# Patient Record
Sex: Female | Born: 1950 | Race: White | Hispanic: No | Marital: Married | Smoking: Never smoker
Health system: Southern US, Community
[De-identification: ages and names within clinical notes are randomized; demographics above are authoritative.]

## PROBLEM LIST (undated history)

## (undated) DIAGNOSIS — K5792 Diverticulitis of intestine, part unspecified, without perforation or abscess without bleeding: Secondary | ICD-10-CM

## (undated) DIAGNOSIS — J45909 Unspecified asthma, uncomplicated: Secondary | ICD-10-CM

## (undated) DIAGNOSIS — K219 Gastro-esophageal reflux disease without esophagitis: Secondary | ICD-10-CM

## (undated) DIAGNOSIS — D649 Anemia, unspecified: Secondary | ICD-10-CM

## (undated) HISTORY — PX: TUBAL LIGATION: SHX77

## (undated) HISTORY — PX: FOOT SURGERY: SHX648

## (undated) HISTORY — PX: EYE SURGERY: SHX253

## (undated) HISTORY — PX: TONSILLECTOMY: SUR1361

---

## 2004-01-15 ENCOUNTER — Ambulatory Visit (HOSPITAL_COMMUNITY): Admission: RE | Admit: 2004-01-15 | Discharge: 2004-01-15 | Payer: Self-pay | Admitting: Dentistry

## 2004-09-13 ENCOUNTER — Emergency Department (HOSPITAL_COMMUNITY): Admission: EM | Admit: 2004-09-13 | Discharge: 2004-09-13 | Payer: Self-pay | Admitting: Emergency Medicine

## 2010-01-08 ENCOUNTER — Emergency Department (HOSPITAL_COMMUNITY): Admission: EM | Admit: 2010-01-08 | Discharge: 2010-01-09 | Payer: Self-pay | Admitting: Emergency Medicine

## 2010-07-01 ENCOUNTER — Ambulatory Visit (HOSPITAL_BASED_OUTPATIENT_CLINIC_OR_DEPARTMENT_OTHER): Admission: RE | Admit: 2010-07-01 | Discharge: 2010-07-01 | Payer: Self-pay | Admitting: Family Medicine

## 2010-07-01 ENCOUNTER — Ambulatory Visit: Payer: Self-pay | Admitting: Interventional Radiology

## 2010-12-06 LAB — DIFFERENTIAL
Basophils Absolute: 0.1 10*3/uL (ref 0.0–0.1)
Eosinophils Absolute: 0.3 10*3/uL (ref 0.0–0.7)
Eosinophils Relative: 2 % (ref 0–5)
Lymphocytes Relative: 22 % (ref 12–46)
Monocytes Absolute: 1 10*3/uL (ref 0.1–1.0)

## 2010-12-06 LAB — POCT I-STAT, CHEM 8
BUN: 4 mg/dL — ABNORMAL LOW (ref 6–23)
Chloride: 98 mEq/L (ref 96–112)
Creatinine, Ser: 0.7 mg/dL (ref 0.4–1.2)
Glucose, Bld: 104 mg/dL — ABNORMAL HIGH (ref 70–99)
HCT: 44 % (ref 36.0–46.0)
Potassium: 3.6 mEq/L (ref 3.5–5.1)

## 2010-12-06 LAB — URINALYSIS, ROUTINE W REFLEX MICROSCOPIC
Bilirubin Urine: NEGATIVE
Glucose, UA: NEGATIVE mg/dL
Ketones, ur: NEGATIVE mg/dL
Nitrite: NEGATIVE
Protein, ur: NEGATIVE mg/dL
pH: 6.5 (ref 5.0–8.0)

## 2010-12-06 LAB — CBC
HCT: 37.9 % (ref 36.0–46.0)
Hemoglobin: 12.5 g/dL (ref 12.0–15.0)
MCV: 84.2 fL (ref 78.0–100.0)
RDW: 13.2 % (ref 11.5–15.5)

## 2010-12-06 LAB — URINE MICROSCOPIC-ADD ON

## 2015-10-16 ENCOUNTER — Encounter (HOSPITAL_BASED_OUTPATIENT_CLINIC_OR_DEPARTMENT_OTHER): Payer: Self-pay | Admitting: *Deleted

## 2015-10-16 ENCOUNTER — Observation Stay (HOSPITAL_BASED_OUTPATIENT_CLINIC_OR_DEPARTMENT_OTHER)
Admission: EM | Admit: 2015-10-16 | Discharge: 2015-10-18 | Disposition: A | Discharge status: Home / Self Care | Payer: BLUE CROSS/BLUE SHIELD | Attending: General Surgery | Admitting: General Surgery

## 2015-10-16 ENCOUNTER — Emergency Department (HOSPITAL_BASED_OUTPATIENT_CLINIC_OR_DEPARTMENT_OTHER): Payer: BLUE CROSS/BLUE SHIELD

## 2015-10-16 DIAGNOSIS — K8000 Calculus of gallbladder with acute cholecystitis without obstruction: Secondary | ICD-10-CM | POA: Diagnosis present

## 2015-10-16 DIAGNOSIS — Z79899 Other long term (current) drug therapy: Secondary | ICD-10-CM | POA: Diagnosis not present

## 2015-10-16 DIAGNOSIS — K219 Gastro-esophageal reflux disease without esophagitis: Secondary | ICD-10-CM | POA: Diagnosis not present

## 2015-10-16 DIAGNOSIS — J45909 Unspecified asthma, uncomplicated: Secondary | ICD-10-CM | POA: Insufficient documentation

## 2015-10-16 DIAGNOSIS — R1013 Epigastric pain: Secondary | ICD-10-CM | POA: Diagnosis present

## 2015-10-16 DIAGNOSIS — Z7951 Long term (current) use of inhaled steroids: Secondary | ICD-10-CM | POA: Insufficient documentation

## 2015-10-16 DIAGNOSIS — K8010 Calculus of gallbladder with chronic cholecystitis without obstruction: Secondary | ICD-10-CM | POA: Diagnosis not present

## 2015-10-16 DIAGNOSIS — E669 Obesity, unspecified: Secondary | ICD-10-CM | POA: Diagnosis not present

## 2015-10-16 DIAGNOSIS — Z6833 Body mass index (BMI) 33.0-33.9, adult: Secondary | ICD-10-CM | POA: Diagnosis not present

## 2015-10-16 DIAGNOSIS — K819 Cholecystitis, unspecified: Secondary | ICD-10-CM

## 2015-10-16 HISTORY — DX: Unspecified asthma, uncomplicated: J45.909

## 2015-10-16 HISTORY — DX: Diverticulitis of intestine, part unspecified, without perforation or abscess without bleeding: K57.92

## 2015-10-16 HISTORY — DX: Anemia, unspecified: D64.9

## 2015-10-16 HISTORY — DX: Gastro-esophageal reflux disease without esophagitis: K21.9

## 2015-10-16 LAB — URINALYSIS, ROUTINE W REFLEX MICROSCOPIC
BILIRUBIN URINE: NEGATIVE
Glucose, UA: NEGATIVE mg/dL
Hgb urine dipstick: NEGATIVE
Ketones, ur: 15 mg/dL — AB
LEUKOCYTES UA: NEGATIVE
NITRITE: NEGATIVE
Protein, ur: NEGATIVE mg/dL
SPECIFIC GRAVITY, URINE: 1.018 (ref 1.005–1.030)
pH: 8 (ref 5.0–8.0)

## 2015-10-16 LAB — CBC WITH DIFFERENTIAL/PLATELET
BASOS ABS: 0 10*3/uL (ref 0.0–0.1)
BASOS PCT: 0 %
EOS PCT: 0 %
Eosinophils Absolute: 0 10*3/uL (ref 0.0–0.7)
HCT: 40.8 % (ref 36.0–46.0)
Hemoglobin: 13.3 g/dL (ref 12.0–15.0)
Lymphocytes Relative: 10 %
Lymphs Abs: 1.5 10*3/uL (ref 0.7–4.0)
MCH: 27.3 pg (ref 26.0–34.0)
MCHC: 32.6 g/dL (ref 30.0–36.0)
MCV: 83.6 fL (ref 78.0–100.0)
MONO ABS: 0.5 10*3/uL (ref 0.1–1.0)
MONOS PCT: 3 %
Neutro Abs: 12.6 10*3/uL — ABNORMAL HIGH (ref 1.7–7.7)
Neutrophils Relative %: 87 %
PLATELETS: 336 10*3/uL (ref 150–400)
RBC: 4.88 MIL/uL (ref 3.87–5.11)
RDW: 13.7 % (ref 11.5–15.5)
WBC: 14.7 10*3/uL — ABNORMAL HIGH (ref 4.0–10.5)

## 2015-10-16 LAB — COMPREHENSIVE METABOLIC PANEL
ALBUMIN: 4 g/dL (ref 3.5–5.0)
ALT: 39 U/L (ref 14–54)
ANION GAP: 8 (ref 5–15)
AST: 38 U/L (ref 15–41)
Alkaline Phosphatase: 63 U/L (ref 38–126)
BUN: 8 mg/dL (ref 6–20)
CHLORIDE: 101 mmol/L (ref 101–111)
CO2: 26 mmol/L (ref 22–32)
Calcium: 8.6 mg/dL — ABNORMAL LOW (ref 8.9–10.3)
Creatinine, Ser: 0.58 mg/dL (ref 0.44–1.00)
GFR calc Af Amer: >60 mL/min (ref >60)
Glucose, Bld: 198 mg/dL — ABNORMAL HIGH (ref 65–99)
POTASSIUM: 4.1 mmol/L (ref 3.5–5.1)
Sodium: 135 mmol/L (ref 135–145)
Total Bilirubin: 0.6 mg/dL (ref 0.3–1.2)
Total Protein: 7.7 g/dL (ref 6.5–8.1)

## 2015-10-16 LAB — LIPASE, BLOOD: Lipase: 21 U/L (ref 11–51)

## 2015-10-16 MED ORDER — GI COCKTAIL ~~LOC~~
30.0000 mL | Freq: Once | ORAL | Status: AC
  Administered 2015-10-16: 30 mL via ORAL
  Filled 2015-10-16: qty 30

## 2015-10-16 MED ORDER — FENTANYL CITRATE (PF) 100 MCG/2ML IJ SOLN
100.0000 ug | Freq: Once | INTRAMUSCULAR | Status: AC
  Administered 2015-10-16: 100 ug via INTRAVENOUS
  Filled 2015-10-16: qty 2

## 2015-10-16 MED ORDER — ONDANSETRON HCL 4 MG/2ML IJ SOLN
4.0000 mg | Freq: Once | INTRAMUSCULAR | Status: AC
  Administered 2015-10-16: 4 mg via INTRAVENOUS
  Filled 2015-10-16: qty 2

## 2015-10-16 NOTE — ED Provider Notes (Signed)
CSN: [id number]     Arrival date & time 10/16/15  2234 History  By signing my name below, I, Alicia Dawson, attest that this documentation has been prepared under the direction and in the presence of Myrtle Barnhard, MD.  Electronically Signed: Bethel Dawson, ED Scribe. 10/17/2015. 12:45 AM   Chief Complaint  Patient presents with  . Abdominal Pain    Patient is a 65 y.o. female presenting with abdominal pain. The history is provided by the patient. No language interpreter was used.  Abdominal Pain Pain location:  Generalized Pain quality: dull and sharp   Pain radiates to:  Does not radiate Pain severity:  Severe Onset quality:  Gradual Duration:  9 hours Timing:  Constant Progression:  Worsening Chronicity:  New Context: eating   Context: not sick contacts and not trauma   Relieved by:  Nothing Worsened by:  Nothing tried Ineffective treatments:  None tried Associated symptoms: flatus and nausea   Associated symptoms: no chest pain, no constipation, no diarrhea, no fever and no vomiting   Risk factors: no alcohol abuse    Alicia Dawson is a 65 y.o. female with history of diverticulitis who presents to the Emergency Department complaining of progressively worsening, 10/10 in severity, diffuse abdominal pain with gradual onset near 2:30 PM today. She states that the pain is sharp in the upper abdomen and dull in the lower abdomen. Prior to the onset of pain she had eaten Hibachi. Pt took a dose of hydrocodone, that she had left over from a foot surgery, but was unable to hold it down.  Associated symptoms include flatulence over the last several days, nausea, and dry heaving. She has had a cough over the last several days but denies diarrhea and constipation. No known sick contact. No new medication.   Past Medical History  Diagnosis Date  . Diverticulitis    Past Surgical History  Procedure Laterality Date  . Tonsillectomy    . Foot surgery    . Eye surgery    .  Tubal ligation     History reviewed. No pertinent family history. Social History  Substance Use Topics  . Smoking status: Never Smoker   . Smokeless tobacco: None  . Alcohol Use: Yes   OB History    No data available     Review of Systems  Constitutional: Negative for fever.  Cardiovascular: Negative for chest pain.  Gastrointestinal: Positive for nausea, abdominal pain and flatus. Negative for vomiting, diarrhea and constipation.  All other systems reviewed and are negative.  Allergies  Penicillins  Home Medications   Prior to Admission medications   Medication Sig Start Date End Date Taking? Authorizing Provider  albuterol (PROVENTIL HFA;VENTOLIN HFA) 108 (90 Base) MCG/ACT inhaler Inhale into the lungs every 6 (six) hours as needed for wheezing or shortness of breath.   Yes Historical Provider, MD  budesonide-formoterol (SYMBICORT) 160-4.5 MCG/ACT inhaler Inhale 2 puffs into the lungs 2 (two) times daily.   Yes Historical Provider, MD  esomeprazole (NEXIUM) 20 MG capsule Take 20 mg by mouth daily at 12 noon.   Yes Historical Provider, MD  montelukast (SINGULAIR) 10 MG tablet Take 10 mg by mouth at bedtime.   Yes Historical Provider, MD   BP 149/102 mmHg  Pulse 83  Temp(Src) 98.5 F (36.9 C) (Oral)  Resp 22  Ht 5\' 4"  (1.626 m)  Wt 203 lb 1.6 oz (92.126 kg)  BMI 34.85 kg/m2  SpO2 97% Physical Exam  Constitutional: She is oriented to  person, place, and time. She appears well-developed and well-nourished.  HENT:  Head: Normocephalic and atraumatic.  Mouth/Throat: Oropharynx is clear and moist.  Moist mucous membranes No exudate  Eyes: Pupils are equal, round, and reactive to light.  Neck: Normal range of motion. Neck supple.  Trachea midline No stridor No bruit  Cardiovascular: Normal rate and regular rhythm.   Pulmonary/Chest: Effort normal and breath sounds normal. No respiratory distress. She has no wheezes. She has no rales.  CTAB  Abdominal: Soft. She  exhibits no mass. Bowel sounds are increased. There is tenderness in the right upper quadrant and epigastric area. There is positive Murphy's sign. There is no rebound and no guarding.  Musculoskeletal: Normal range of motion.  Neurological: She is alert and oriented to person, place, and time. She has normal reflexes.  Skin: Skin is warm and dry.  Psychiatric: She has a normal mood and affect. Her behavior is normal.  Nursing note and vitals reviewed.   ED Course  Procedures (including critical care time) DIAGNOSTIC STUDIES: Oxygen Saturation is 97% on RA,  normal by my interpretation.    COORDINATION OF CARE: 11:16 PM Discussed treatment plan which includes lab work, abdominal US, Zofran, fentanyl, and GI cocktail with pt at bedside and pt agreed to plan.  12:39 AM-Consult complete with Dr. Donell Beers (Surgery). Patient case explained and discussed. Pt accepted to the ED at [facility]. She is to be started on Levaquin as an abx due to her penicillin allergy.   12:44 AM D/w Dr. Linwood Dibbles, ED Attending at [facility].   Labs Review Labs Reviewed  CBC WITH DIFFERENTIAL/PLATELET - Abnormal; Notable for the following:    WBC 14.7 (*)    Neutro Abs 12.6 (*)    All other components within normal limits  COMPREHENSIVE METABOLIC PANEL - Abnormal; Notable for the following:    Glucose, Bld 198 (*)    Calcium 8.6 (*)    All other components within normal limits  URINALYSIS, ROUTINE W REFLEX MICROSCOPIC (NOT AT [facility]) - Abnormal; Notable for the following:    Ketones, ur 15 (*)    All other components within normal limits  LIPASE, BLOOD    Imaging Review US Abdomen Complete  10/17/2015  CLINICAL DATA:  Right lower quadrant pain radiating to the abdomen since 2 p.m. after eating steak for launch. Nausea and vomiting. EXAM: ABDOMEN ULTRASOUND COMPLETE COMPARISON:  CT abdomen and pelvis 07/01/2010 FINDINGS: Gallbladder: Cholelithiasis with a in nonmobile stone demonstrated in the gallbladder  neck. Stone measures about 1.6 cm diameter. Gallbladder wall thickness is borderline. Murphy's sign is positive. Changes are consistent with acute cholecystitis in the appropriate clinical setting. Common bile duct: Diameter: 4.4 mm, normal Liver: Diffusely increased parenchymal echotexture consistent with fatty infiltration. No focal liver lesions identified. IVC: Not seen due to overlying bowel gas. Pancreas: Not seen due to overlying bowel gas. Spleen: Size and appearance within normal limits. Right Kidney: Length: 10.8 cm. Echogenicity within normal limits. No mass or hydronephrosis visualized. Left Kidney: Length: 11.3 cm. Echogenicity within normal limits. No mass or hydronephrosis visualized. Abdominal aorta: Mostly obscured by overlying bowel gas. Visualized portions are non aneurysmal. Other findings: Examination is technically limited due to bowel gas and patient's body habitus. IMPRESSION: Cholelithiasis with mild gallbladder wall thickening and positive Murphy's sign. Changes are consistent with acute cholecystitis in the appropriate clinical setting. Diffuse fatty infiltration of the liver. Electronically Signed   By: Burman Nieves M.D.   On: 10/17/2015 00:23  I have personally reviewed and evaluated these images and lab results as part of my medical decision-making.   EKG Interpretation None      MDM   Final diagnoses:  Cholecystitis    Results for orders placed or performed during the hospital encounter of 10/16/15  CBC with Differential/Platelet  Result Value Ref Range   WBC 14.7 (H) 4.0 - 10.5 K/uL   RBC 4.88 3.87 - 5.11 MIL/uL   Hemoglobin 13.3 12.0 - 15.0 g/dL   HCT 40.9 81.1 - 91.4 %   MCV 83.6 78.0 - 100.0 fL   MCH 27.3 26.0 - 34.0 pg   MCHC 32.6 30.0 - 36.0 g/dL   RDW 78.2 95.6 - 21.3 %   Platelets 336 150 - 400 K/uL   Neutrophils Relative % 87 %   Neutro Abs 12.6 (H) 1.7 - 7.7 K/uL   Lymphocytes Relative 10 %   Lymphs Abs 1.5 0.7 - 4.0 K/uL   Monocytes  Relative 3 %   Monocytes Absolute 0.5 0.1 - 1.0 K/uL   Eosinophils Relative 0 %   Eosinophils Absolute 0.0 0.0 - 0.7 K/uL   Basophils Relative 0 %   Basophils Absolute 0.0 0.0 - 0.1 K/uL  Comprehensive metabolic panel  Result Value Ref Range   Sodium 135 135 - 145 mmol/L   Potassium 4.1 3.5 - 5.1 mmol/L   Chloride 101 101 - 111 mmol/L   CO2 26 22 - 32 mmol/L   Glucose, Bld 198 (H) 65 - 99 mg/dL   BUN 8 6 - 20 mg/dL   Creatinine, Ser 0.86 0.44 - 1.00 mg/dL   Calcium 8.6 (L) 8.9 - 10.3 mg/dL   Total Protein 7.7 6.5 - 8.1 g/dL   Albumin 4.0 3.5 - 5.0 g/dL   AST 38 15 - 41 U/L   ALT 39 14 - 54 U/L   Alkaline Phosphatase 63 38 - 126 U/L   Total Bilirubin 0.6 0.3 - 1.2 mg/dL   GFR calc non Af Amer >60 >60 mL/min   GFR calc Af Amer >60 >60 mL/min   Anion gap 8 5 - 15  Lipase, blood  Result Value Ref Range   Lipase 21 11 - 51 U/L  Urinalysis, Routine w reflex microscopic (not at [facility])  Result Value Ref Range   Color, Urine YELLOW YELLOW   APPearance CLEAR CLEAR   Specific Gravity, Urine 1.018 1.005 - 1.030   pH 8.0 5.0 - 8.0   Glucose, UA NEGATIVE NEGATIVE mg/dL   Hgb urine dipstick NEGATIVE NEGATIVE   Bilirubin Urine NEGATIVE NEGATIVE   Ketones, ur 15 (A) NEGATIVE mg/dL   Protein, ur NEGATIVE NEGATIVE mg/dL   Nitrite NEGATIVE NEGATIVE   Leukocytes, UA NEGATIVE NEGATIVE   US Abdomen Complete  10/17/2015  CLINICAL DATA:  Right lower quadrant pain radiating to the abdomen since 2 p.m. after eating steak for launch. Nausea and vomiting. EXAM: ABDOMEN ULTRASOUND COMPLETE COMPARISON:  CT abdomen and pelvis 07/01/2010 FINDINGS: Gallbladder: Cholelithiasis with a in nonmobile stone demonstrated in the gallbladder neck. Stone measures about 1.6 cm diameter. Gallbladder wall thickness is borderline. Murphy's sign is positive. Changes are consistent with acute cholecystitis in the appropriate clinical setting. Common bile duct: Diameter: 4.4 mm, normal Liver: Diffusely increased parenchymal  echotexture consistent with fatty infiltration. No focal liver lesions identified. IVC: Not seen due to overlying bowel gas. Pancreas: Not seen due to overlying bowel gas. Spleen: Size and appearance within normal limits. Right Kidney: Length: 10.8 cm. Echogenicity within normal  limits. No mass or hydronephrosis visualized. Left Kidney: Length: 11.3 cm. Echogenicity within normal limits. No mass or hydronephrosis visualized. Abdominal aorta: Mostly obscured by overlying bowel gas. Visualized portions are non aneurysmal. Other findings: Examination is technically limited due to bowel gas and patient's body habitus. IMPRESSION: Cholelithiasis with mild gallbladder wall thickening and positive Murphy's sign. Changes are consistent with acute cholecystitis in the appropriate clinical setting. Diffuse fatty infiltration of the liver. Electronically Signed   By: Burman Nieves M.D.   On: 10/17/2015 00:23    Medications  levofloxacin (LEVAQUIN) IVPB 500 mg (500 mg Intravenous New Bag/Given 10/17/15 0054)  ondansetron (ZOFRAN) injection 4 mg (4 mg Intravenous Given 10/16/15 2330)  gi cocktail (Maalox,Lidocaine,Donnatal) (30 mLs Oral Given 10/16/15 2330)  fentaNYL (SUBLIMAZE) injection 100 mcg (100 mcg Intravenous Given 10/16/15 2330)  morphine 4 MG/ML injection 4 mg (4 mg Intravenous Given 10/17/15 0054)      I personally performed the services described in this documentation, which was scribed in my presence. The recorded information has been reviewed and is accurate.      Powell Halbert, MD 10/17/15 0100

## 2015-10-16 NOTE — ED Notes (Signed)
Patient is alert and oriented x4.  She is complaining of abdominal pain that started at 14:30.  She denies any history  Of this issue.  Patient has nausea with dry heaves.  Currently she rates her pain 10 of 10.

## 2015-10-17 ENCOUNTER — Observation Stay (HOSPITAL_COMMUNITY): Payer: BLUE CROSS/BLUE SHIELD

## 2015-10-17 ENCOUNTER — Encounter (HOSPITAL_COMMUNITY): Admission: EM | Disposition: A | Discharge status: Home / Self Care | Payer: Self-pay | Source: Home / Self Care

## 2015-10-17 ENCOUNTER — Observation Stay (HOSPITAL_COMMUNITY): Payer: BLUE CROSS/BLUE SHIELD | Admitting: Certified Registered Nurse Anesthetist

## 2015-10-17 ENCOUNTER — Encounter (HOSPITAL_BASED_OUTPATIENT_CLINIC_OR_DEPARTMENT_OTHER): Payer: Self-pay | Admitting: Emergency Medicine

## 2015-10-17 DIAGNOSIS — K819 Cholecystitis, unspecified: Secondary | ICD-10-CM | POA: Diagnosis present

## 2015-10-17 DIAGNOSIS — K8000 Calculus of gallbladder with acute cholecystitis without obstruction: Secondary | ICD-10-CM | POA: Diagnosis present

## 2015-10-17 HISTORY — PX: CHOLECYSTECTOMY: SHX55

## 2015-10-17 LAB — SURGICAL PCR SCREEN
MRSA, PCR: NEGATIVE
STAPHYLOCOCCUS AUREUS: NEGATIVE

## 2015-10-17 SURGERY — LAPAROSCOPIC CHOLECYSTECTOMY WITH INTRAOPERATIVE CHOLANGIOGRAM
Anesthesia: General | Site: Abdomen

## 2015-10-17 MED ORDER — LIDOCAINE HCL 1 % IJ SOLN
INTRAMUSCULAR | Status: DC | PRN
Start: 2015-10-17 — End: 2015-10-17
  Administered 2015-10-17: 5 mL

## 2015-10-17 MED ORDER — MIDAZOLAM HCL 2 MG/2ML IJ SOLN
INTRAMUSCULAR | Status: AC
  Filled 2015-10-17: qty 2

## 2015-10-17 MED ORDER — DEXAMETHASONE SODIUM PHOSPHATE 10 MG/ML IJ SOLN
INTRAMUSCULAR | Status: DC | PRN
  Administered 2015-10-17: 10 mg via INTRAVENOUS

## 2015-10-17 MED ORDER — CIPROFLOXACIN IN D5W 400 MG/200ML IV SOLN
INTRAVENOUS | Status: AC
  Filled 2015-10-17: qty 200

## 2015-10-17 MED ORDER — 0.9 % SODIUM CHLORIDE (POUR BTL) OPTIME
TOPICAL | Status: DC | PRN
  Administered 2015-10-17: 1000 mL

## 2015-10-17 MED ORDER — LACTATED RINGERS IR SOLN
Status: DC | PRN
  Administered 2015-10-17: 1

## 2015-10-17 MED ORDER — ALBUTEROL SULFATE HFA 108 (90 BASE) MCG/ACT IN AERS
INHALATION_SPRAY | RESPIRATORY_TRACT | Status: DC | PRN
  Administered 2015-10-17: 2 via RESPIRATORY_TRACT
  Administered 2015-10-17: 5 via RESPIRATORY_TRACT

## 2015-10-17 MED ORDER — PROMETHAZINE HCL 25 MG/ML IJ SOLN: 6.2500 mg | INTRAMUSCULAR | Status: DC | PRN

## 2015-10-17 MED ORDER — DEXAMETHASONE SODIUM PHOSPHATE 10 MG/ML IJ SOLN
INTRAMUSCULAR | Status: AC
  Filled 2015-10-17: qty 1

## 2015-10-17 MED ORDER — LEVOFLOXACIN IN D5W 500 MG/100ML IV SOLN: 500.0000 mg | INTRAVENOUS | Status: DC

## 2015-10-17 MED ORDER — CIPROFLOXACIN IN D5W 400 MG/200ML IV SOLN
400.0000 mg | Freq: Two times a day (BID) | INTRAVENOUS | Status: DC
  Administered 2015-10-17 (×2): 400 mg via INTRAVENOUS
  Filled 2015-10-17 (×2): qty 200

## 2015-10-17 MED ORDER — KCL IN DEXTROSE-NACL 20-5-0.45 MEQ/L-%-% IV SOLN
INTRAVENOUS | Status: DC
  Administered 2015-10-17: 16:00:00 via INTRAVENOUS
  Administered 2015-10-17: 100 mL/h via INTRAVENOUS
  Administered 2015-10-17 (×2): via INTRAVENOUS
  Filled 2015-10-17 (×4): qty 1000

## 2015-10-17 MED ORDER — OXYCODONE HCL 5 MG PO TABS
5.0000 mg | ORAL_TABLET | ORAL | Status: DC | PRN
  Administered 2015-10-17 (×2): 5 mg via ORAL
  Administered 2015-10-18: 10 mg via ORAL
  Administered 2015-10-18: 5 mg via ORAL
  Filled 2015-10-17: qty 1
  Filled 2015-10-17: qty 2
  Filled 2015-10-17 (×2): qty 1

## 2015-10-17 MED ORDER — MONTELUKAST SODIUM 10 MG PO TABS
10.0000 mg | ORAL_TABLET | Freq: Every day | ORAL | Status: DC
  Administered 2015-10-17: 10 mg via ORAL
  Filled 2015-10-17 (×2): qty 1

## 2015-10-17 MED ORDER — BUPIVACAINE-EPINEPHRINE (PF) 0.25% -1:200000 IJ SOLN
INTRAMUSCULAR | Status: AC
  Filled 2015-10-17: qty 30

## 2015-10-17 MED ORDER — DEXTROSE-NACL 5-0.45 % IV SOLN
INTRAVENOUS | Status: AC
  Administered 2015-10-17: 05:00:00 via INTRAVENOUS

## 2015-10-17 MED ORDER — LEVOFLOXACIN IN D5W 500 MG/100ML IV SOLN
500.0000 mg | Freq: Once | INTRAVENOUS | Status: AC
  Administered 2015-10-17: 500 mg via INTRAVENOUS
  Filled 2015-10-17: qty 100

## 2015-10-17 MED ORDER — PROPOFOL 10 MG/ML IV BOLUS
INTRAVENOUS | Status: AC
  Filled 2015-10-17: qty 20

## 2015-10-17 MED ORDER — ROCURONIUM BROMIDE 100 MG/10ML IV SOLN
INTRAVENOUS | Status: AC
  Filled 2015-10-17: qty 1

## 2015-10-17 MED ORDER — SUCCINYLCHOLINE CHLORIDE 20 MG/ML IJ SOLN
INTRAMUSCULAR | Status: DC | PRN
  Administered 2015-10-17: 100 mg via INTRAVENOUS

## 2015-10-17 MED ORDER — LACTATED RINGERS IV SOLN
INTRAVENOUS | Status: DC | PRN
  Administered 2015-10-17: 09:00:00 via INTRAVENOUS

## 2015-10-17 MED ORDER — DIPHENHYDRAMINE HCL 12.5 MG/5ML PO ELIX: 12.5000 mg | ORAL_SOLUTION | Freq: Four times a day (QID) | ORAL | Status: DC | PRN

## 2015-10-17 MED ORDER — PROPOFOL 10 MG/ML IV BOLUS
INTRAVENOUS | Status: DC | PRN
  Administered 2015-10-17: 150 mg via INTRAVENOUS

## 2015-10-17 MED ORDER — FENTANYL CITRATE (PF) 100 MCG/2ML IJ SOLN
INTRAMUSCULAR | Status: DC | PRN
  Administered 2015-10-17 (×5): 50 ug via INTRAVENOUS

## 2015-10-17 MED ORDER — ONDANSETRON HCL 4 MG/2ML IJ SOLN
4.0000 mg | Freq: Three times a day (TID) | INTRAMUSCULAR | Status: AC | PRN
  Administered 2015-10-17: 4 mg via INTRAVENOUS
  Filled 2015-10-17: qty 2

## 2015-10-17 MED ORDER — HYDROMORPHONE HCL 1 MG/ML IJ SOLN
1.0000 mg | INTRAMUSCULAR | Status: DC | PRN
Start: 2015-10-17 — End: 2015-10-17

## 2015-10-17 MED ORDER — DIPHENHYDRAMINE HCL 50 MG/ML IJ SOLN: 12.5000 mg | Freq: Four times a day (QID) | INTRAMUSCULAR | Status: DC | PRN

## 2015-10-17 MED ORDER — MORPHINE SULFATE (PF) 4 MG/ML IV SOLN
4.0000 mg | Freq: Once | INTRAVENOUS | Status: AC
  Administered 2015-10-17: 4 mg via INTRAVENOUS
  Filled 2015-10-17: qty 1

## 2015-10-17 MED ORDER — HYDROMORPHONE HCL 1 MG/ML IJ SOLN
0.5000 mg | INTRAMUSCULAR | Status: DC | PRN
  Administered 2015-10-17 (×2): 0.5 mg via INTRAVENOUS
  Filled 2015-10-17 (×2): qty 1

## 2015-10-17 MED ORDER — HYDROMORPHONE HCL 1 MG/ML IJ SOLN
INTRAMUSCULAR | Status: AC
  Filled 2015-10-17: qty 1

## 2015-10-17 MED ORDER — DOCUSATE SODIUM 100 MG PO CAPS
100.0000 mg | ORAL_CAPSULE | Freq: Two times a day (BID) | ORAL | Status: DC
  Administered 2015-10-17 – 2015-10-18 (×2): 100 mg via ORAL
  Filled 2015-10-17 (×3): qty 1

## 2015-10-17 MED ORDER — KETOROLAC TROMETHAMINE 30 MG/ML IJ SOLN
30.0000 mg | Freq: Once | INTRAMUSCULAR | Status: DC
  Administered 2015-10-17: 30 mg via INTRAVENOUS

## 2015-10-17 MED ORDER — LACTATED RINGERS IV SOLN: INTRAVENOUS | Status: DC

## 2015-10-17 MED ORDER — ROCURONIUM BROMIDE 100 MG/10ML IV SOLN
INTRAVENOUS | Status: DC | PRN
  Administered 2015-10-17: 35 mg via INTRAVENOUS

## 2015-10-17 MED ORDER — ONDANSETRON HCL 4 MG/2ML IJ SOLN
INTRAMUSCULAR | Status: AC
  Filled 2015-10-17: qty 2

## 2015-10-17 MED ORDER — LIDOCAINE HCL (CARDIAC) 20 MG/ML IV SOLN
INTRAVENOUS | Status: DC | PRN
  Administered 2015-10-17: 100 mg via INTRAVENOUS

## 2015-10-17 MED ORDER — LIDOCAINE HCL 1 % IJ SOLN
INTRAMUSCULAR | Status: AC
  Filled 2015-10-17: qty 20

## 2015-10-17 MED ORDER — SIMETHICONE 80 MG PO CHEW
40.0000 mg | CHEWABLE_TABLET | Freq: Four times a day (QID) | ORAL | Status: DC | PRN
  Filled 2015-10-17: qty 1

## 2015-10-17 MED ORDER — FENTANYL CITRATE (PF) 250 MCG/5ML IJ SOLN
INTRAMUSCULAR | Status: AC
  Filled 2015-10-17: qty 5

## 2015-10-17 MED ORDER — MEPERIDINE HCL 50 MG/ML IJ SOLN: 6.2500 mg | INTRAMUSCULAR | Status: DC | PRN

## 2015-10-17 MED ORDER — KETOROLAC TROMETHAMINE 30 MG/ML IJ SOLN
INTRAMUSCULAR | Status: AC
  Filled 2015-10-17: qty 1

## 2015-10-17 MED ORDER — ACETAMINOPHEN 650 MG RE SUPP: 650.0000 mg | Freq: Four times a day (QID) | RECTAL | Status: DC | PRN

## 2015-10-17 MED ORDER — ACETAMINOPHEN 325 MG PO TABS: 650.0000 mg | ORAL_TABLET | Freq: Four times a day (QID) | ORAL | Status: DC | PRN

## 2015-10-17 MED ORDER — BUDESONIDE-FORMOTEROL FUMARATE 160-4.5 MCG/ACT IN AERO
2.0000 | INHALATION_SPRAY | Freq: Two times a day (BID) | RESPIRATORY_TRACT | Status: DC
  Administered 2015-10-17 – 2015-10-18 (×3): 2 via RESPIRATORY_TRACT
  Filled 2015-10-17: qty 6

## 2015-10-17 MED ORDER — ALBUTEROL SULFATE HFA 108 (90 BASE) MCG/ACT IN AERS
INHALATION_SPRAY | RESPIRATORY_TRACT | Status: AC
  Filled 2015-10-17: qty 6.7

## 2015-10-17 MED ORDER — LORATADINE 10 MG PO TABS
10.0000 mg | ORAL_TABLET | Freq: Every day | ORAL | Status: DC
  Administered 2015-10-18: 10 mg via ORAL
  Filled 2015-10-17 (×2): qty 1

## 2015-10-17 MED ORDER — METHOCARBAMOL 500 MG PO TABS: 500.0000 mg | ORAL_TABLET | Freq: Four times a day (QID) | ORAL | Status: DC | PRN

## 2015-10-17 MED ORDER — LIDOCAINE HCL (CARDIAC) 20 MG/ML IV SOLN
INTRAVENOUS | Status: AC
  Filled 2015-10-17: qty 5

## 2015-10-17 MED ORDER — PANTOPRAZOLE SODIUM 40 MG PO TBEC
40.0000 mg | DELAYED_RELEASE_TABLET | Freq: Every day | ORAL | Status: DC
  Administered 2015-10-18: 40 mg via ORAL
  Filled 2015-10-17: qty 1

## 2015-10-17 MED ORDER — IOHEXOL 300 MG/ML  SOLN
INTRAMUSCULAR | Status: DC | PRN
  Administered 2015-10-17: 5 mL

## 2015-10-17 MED ORDER — HYDRALAZINE HCL 20 MG/ML IJ SOLN
10.0000 mg | INTRAMUSCULAR | Status: DC | PRN
Start: 2015-10-17 — End: 2015-10-18

## 2015-10-17 MED ORDER — SUGAMMADEX SODIUM 200 MG/2ML IV SOLN
INTRAVENOUS | Status: DC | PRN
  Administered 2015-10-17: 200 mg via INTRAVENOUS

## 2015-10-17 MED ORDER — METRONIDAZOLE IN NACL 5-0.79 MG/ML-% IV SOLN
500.0000 mg | Freq: Three times a day (TID) | INTRAVENOUS | Status: DC
  Administered 2015-10-17 – 2015-10-18 (×4): 500 mg via INTRAVENOUS
  Filled 2015-10-17 (×6): qty 100

## 2015-10-17 MED ORDER — MIDAZOLAM HCL 5 MG/5ML IJ SOLN
INTRAMUSCULAR | Status: DC | PRN
  Administered 2015-10-17: 2 mg via INTRAVENOUS

## 2015-10-17 MED ORDER — ALBUTEROL SULFATE (2.5 MG/3ML) 0.083% IN NEBU: 2.5000 mg | INHALATION_SOLUTION | Freq: Four times a day (QID) | RESPIRATORY_TRACT | Status: DC | PRN

## 2015-10-17 MED ORDER — HYDROMORPHONE HCL 1 MG/ML IJ SOLN
0.2500 mg | INTRAMUSCULAR | Status: DC | PRN
  Administered 2015-10-17 (×2): 0.5 mg via INTRAVENOUS

## 2015-10-17 MED ORDER — BUPIVACAINE-EPINEPHRINE 0.25% -1:200000 IJ SOLN
INTRAMUSCULAR | Status: DC | PRN
  Administered 2015-10-17: 5 mL

## 2015-10-17 MED ORDER — ONDANSETRON HCL 4 MG/2ML IJ SOLN
INTRAMUSCULAR | Status: DC | PRN
  Administered 2015-10-17: 4 mg via INTRAVENOUS

## 2015-10-17 MED ORDER — KCL IN DEXTROSE-NACL 20-5-0.45 MEQ/L-%-% IV SOLN
INTRAVENOUS | Status: AC
  Filled 2015-10-17: qty 1000

## 2015-10-17 SURGICAL SUPPLY — 31 items
APPLIER CLIP ROT 10 11.4 M/L (STAPLE) ×3
APR CLP MED LRG 11.4X10 (STAPLE) ×1
BAG SPEC RTRVL LRG 6X4 10 (ENDOMECHANICALS)
CATH REDDICK CHOLANGI 4FR 50CM (CATHETERS) ×2 IMPLANT
CHLORAPREP W/TINT 26ML (MISCELLANEOUS) ×3 IMPLANT
CLIP APPLIE ROT 10 11.4 M/L (STAPLE) ×1 IMPLANT
COVER MAYO STAND STRL (DRAPES) ×3 IMPLANT
COVER SURGICAL LIGHT HANDLE (MISCELLANEOUS) IMPLANT
DECANTER SPIKE VIAL GLASS SM (MISCELLANEOUS) ×3 IMPLANT
DRAPE C-ARM 42X120 X-RAY (DRAPES) ×3 IMPLANT
DRAPE LAPAROSCOPIC ABDOMINAL (DRAPES) ×3 IMPLANT
ELECT REM PT RETURN 9FT ADLT (ELECTROSURGICAL) ×3
ELECTRODE REM PT RTRN 9FT ADLT (ELECTROSURGICAL) ×1 IMPLANT
GLOVE BIOGEL PI IND STRL 7.5 (GLOVE) ×1 IMPLANT
GLOVE BIOGEL PI INDICATOR 7.5 (GLOVE) ×2
GLOVE ECLIPSE 7.5 STRL STRAW (GLOVE) ×3 IMPLANT
GOWN STRL REUS W/TWL XL LVL3 (GOWN DISPOSABLE) ×12 IMPLANT
HEMOSTAT SNOW SURGICEL 2X4 (HEMOSTASIS) IMPLANT
HEMOSTAT SURGICEL 4X8 (HEMOSTASIS) IMPLANT
KIT BASIN OR (CUSTOM PROCEDURE TRAY) ×3 IMPLANT
LIQUID BAND (GAUZE/BANDAGES/DRESSINGS) ×3 IMPLANT
POUCH SPECIMEN RETRIEVAL 10MM (ENDOMECHANICALS) IMPLANT
SCISSORS LAP 5X35 DISP (ENDOMECHANICALS) ×3 IMPLANT
SET CHOLANGIOGRAPH MIX (MISCELLANEOUS) ×3 IMPLANT
SET IRRIG TUBING LAPAROSCOPIC (IRRIGATION / IRRIGATOR) ×3 IMPLANT
SLEEVE XCEL OPT CAN 5 100 (ENDOMECHANICALS) ×3 IMPLANT
SUT MNCRL AB 4-0 PS2 18 (SUTURE) ×3 IMPLANT
TRAY LAPAROSCOPIC (CUSTOM PROCEDURE TRAY) ×3 IMPLANT
TROCAR BLADELESS OPT 5 100 (ENDOMECHANICALS) ×3 IMPLANT
TROCAR XCEL BLUNT TIP 100MML (ENDOMECHANICALS) ×3 IMPLANT
TROCAR XCEL NON-BLD 11X100MML (ENDOMECHANICALS) ×3 IMPLANT

## 2015-10-17 NOTE — Transfer of Care (Signed)
Immediate Anesthesia Transfer of Care Note  Patient: Alicia Dawson  Procedure(s) Performed: Procedure(s): LAPAROSCOPIC CHOLECYSTECTOMY WITH INTRAOPERATIVE CHOLANGIOGRAM (N/A)  Patient Location: PACU  Anesthesia Type:General  Level of Consciousness:  sedated, patient cooperative and responds to stimulation  Airway & Oxygen Therapy:Patient Spontanous Breathing and Patient connected to face mask oxgen  Post-op Assessment:  Report given to PACU RN and Post -op Vital signs reviewed and stable  Post vital signs:  Reviewed and stable  Last Vitals:  Filed Vitals:   10/17/15 0827 10/17/15 1045  BP: 142/60   Pulse: 74   Temp: 36.7 C 36.7 C  Resp: 16     Complications: No apparent anesthesia complications

## 2015-10-17 NOTE — ED Provider Notes (Signed)
Dr Donell Beers requested I write holding orders to get the patient admitted upstairs.   Plan on OR later today.  Linwood Dibbles, MD 10/17/15 [phone]

## 2015-10-17 NOTE — Anesthesia Postprocedure Evaluation (Signed)
Anesthesia Post Note  Patient: Alicia Dawson  Procedure(s) Performed: Procedure(s) (LRB): LAPAROSCOPIC CHOLECYSTECTOMY WITH INTRAOPERATIVE CHOLANGIOGRAM (N/A)  Patient location during evaluation: PACU Anesthesia Type: General Level of consciousness: sedated, patient cooperative and oriented Pain management: pain level controlled Vital Signs Assessment: post-procedure vital signs reviewed and stable Respiratory status: spontaneous breathing Cardiovascular status: stable Postop Assessment: no signs of nausea or vomiting Anesthetic complications: no    Last Vitals:  Filed Vitals:   10/17/15 0827 10/17/15 1045  BP: 142/60 159/68  Pulse: 74 89  Temp: 36.7 C 36.7 C  Resp: 16 13    Last Pain:  Filed Vitals:   10/17/15 1057  PainSc: 0-No pain                 Jayin Derousse JR,JOHN Jamaiyah Pyle

## 2015-10-17 NOTE — Anesthesia Procedure Notes (Signed)
Procedure Name: Intubation Date/Time: 10/17/2015 9:42 AM Performed by: Orest Dikes Pre-anesthesia Checklist: Patient identified, Emergency Drugs available, Suction available and Patient being monitored Patient Re-evaluated:Patient Re-evaluated prior to inductionOxygen Delivery Method: Circle System Utilized Preoxygenation: Pre-oxygenation with 100% oxygen Intubation Type: IV induction Ventilation: Mask ventilation without difficulty Laryngoscope Size: Mac and 4 Grade View: Grade I Tube type: Oral Tube size: 7.5 mm Number of attempts: 1 Airway Equipment and Method: Stylet Placement Confirmation: ETT inserted through vocal cords under direct vision,  positive ETCO2 and breath sounds checked- equal and bilateral Secured at: 21 cm Tube secured with: Tape Dental Injury: Teeth and Oropharynx as per pre-operative assessment

## 2015-10-17 NOTE — ED Notes (Signed)
Bed: OZ30 Expected date:  Expected time:  Means of arrival:  Comments: [facility]

## 2015-10-17 NOTE — Op Note (Signed)
Laparoscopic Cholecystectomy with IOC Procedure Note  Indications: This patient presents with acute calculous cholecystitis and will undergo laparoscopic cholecystectomy.  Pre-operative Diagnosis: see above  Post-operative Diagnosis: Same  Surgeon: Almond Lint   Assistants: Jaclynn Guarneri, MD  Anesthesia: General endotracheal anesthesia and local  ASA Class: 2  Procedure Details  The patient was seen again in the Holding Room. The risks, benefits, complications, treatment options, and expected outcomes were discussed with the patient. The possibilities of  bleeding, recurrent infection, damage to nearby structures, the need for additional procedures, failure to diagnose a condition, the possible need to convert to an open procedure, and creating a complication requiring transfusion or operation were discussed with the patient. The likelihood of improving the patient's symptoms with return to their baseline status is good.    The patient and/or family concurred with the proposed plan, giving informed consent. The site of surgery properly noted. The patient was taken to Operating Room, and the procedure verified as Laparoscopic Cholecystectomy with Intraoperative Cholangiogram. A Time Out was held and the above information confirmed.  Prior to the induction of general anesthesia, antibiotic prophylaxis was administered. General endotracheal anesthesia was then administered and tolerated well. After the induction, the abdomen was prepped with Chloraprep and draped in the sterile fashion. The patient was positioned in the supine position.  Local anesthetic agent was injected into the skin near the umbilicus and an incision made. We dissected down to the abdominal fascia with blunt dissection.  The fascia was incised vertically and we entered the peritoneal cavity bluntly.  A pursestring suture of 0-Vicryl was placed around the fascial opening.  The Hasson cannula was inserted and secured with the  stay suture.  Pneumoperitoneum was then created with CO2 and tolerated well without any adverse changes in the patient's vital signs. An 11-mm port was placed in the subxiphoid position.  Two 5-mm ports were placed in the right upper quadrant. All skin incisions were infiltrated with a local anesthetic agent before making the incision and placing the trocars.   We positioned the patient in reverse Trendelenburg, tilted slightly to the patient's left.  The gallbladder was identified, the fundus grasped and retracted cephalad. Adhesions were lysed bluntly and with the electrocautery where indicated, taking care not to injure any adjacent organs or viscus. The infundibulum was grasped and retracted laterally, exposing the peritoneum overlying the triangle of Calot. This was then divided and exposed in a blunt fashion. A critical view of the cystic duct and cystic artery was obtained.  The cystic duct was clearly identified and bluntly dissected circumferentially. The cystic duct was ligated with a clip distally.   An incision was made in the cystic duct and the [facility] cholangiogram catheter introduced. The catheter was secured using a clip. A cholangiogram was then performed, demonstrating good filling of common bile duct, common hepatic duct, left and right hepatic ducts, and good filling into the duodenum without filling defects..  The cystic duct was then ligated with clips and divided. The cystic artery was identified, dissected free, ligated with clips and divided as well.   The gallbladder was dissected from the liver bed in retrograde fashion with the electrocautery. The gallbladder was removed and placed in an Endocatch bag.  The gallbladder and Endocatch bag were then removed through the umbilical port site.  The liver bed was irrigated and inspected. Hemostasis was achieved with the electrocautery. Copious irrigation was utilized and was repeatedly aspirated until clear.    We again inspected the right  upper quadrant for hemostasis.  Pneumoperitoneum was released as we removed the trocars.   The pursestring suture was used to close the umbilical fascia.  4-0 Monocryl was used to close the skin.   The skin was cleaned and dry, and Dermabond was applied. The patient was then extubated and brought to the recovery room in stable condition. Instrument, sponge, and needle counts were correct at closure and at the conclusion of the case.   Findings: Early acute cholecystitis.    Estimated Blood Loss: min         Drains: none          Specimens: Gallbladder to pathology       Complications: None; patient tolerated the procedure well.         Disposition: PACU - hemodynamically stable.         Condition: stable

## 2015-10-17 NOTE — H&P (Signed)
Alicia Dawson is an 65 y.o. female.   Chief Complaint: abdominal pain HPI:  Pt is a 65 yo F who presents with severe epigastric abdominal pain after eating hibachi.  She also had associated nausea/vomiting.  She denies diarrhea/constipation.  She has not had any fever/ chills.  The pain is centered in the upper abdomen and is very sharp.  She has a history of diverticulitis and states this is nothing like that.  She is still a little sore, but nothing like the pain from yesterday.    Past Medical History  Diagnosis Date  . Diverticulitis   . Asthma   . GERD (gastroesophageal reflux disease)   . Anemia     Past Surgical History  Procedure Laterality Date  . Tonsillectomy    . Foot surgery    . Eye surgery    . Tubal ligation      History reviewed. No pertinent family history. Social History:  reports that she has never smoked. She does not have any smokeless tobacco history on file. She reports that she drinks alcohol. Her drug history is not on file.  Allergies:  Allergies  Allergen Reactions  . Penicillins Anaphylaxis    Has patient had a PCN reaction causing immediate rash, facial/tongue/throat swelling, SOB or lightheadedness with hypotension: No Has patient had a PCN reaction causing severe rash involving mucus membranes or skin necrosis: Yes Has patient had a PCN reaction that required hospitalization Yes Has patient had a PCN reaction occurring within the last 10 years: no If all of the above answers are "NO", then may proceed with Cephalosporin use.    Medications Prior to Admission  Medication Sig Dispense Refill  . albuterol (PROVENTIL HFA;VENTOLIN HFA) 108 (90 Base) MCG/ACT inhaler Inhale into the lungs every 6 (six) hours as needed for wheezing or shortness of breath.    . budesonide-formoterol (SYMBICORT) 160-4.5 MCG/ACT inhaler Inhale 2 puffs into the lungs 2 (two) times daily.    . cetirizine (ZYRTEC) 10 MG tablet Take 10 mg by mouth daily.    Marland Kitchen  esomeprazole (NEXIUM) 20 MG capsule Take 20 mg by mouth daily at 12 noon.    . montelukast (SINGULAIR) 10 MG tablet Take 10 mg by mouth at bedtime.    . Multiple Vitamin (MULTIVITAMIN WITH MINERALS) TABS tablet Take 1 tablet by mouth daily.      Results for orders placed or performed during the hospital encounter of 10/16/15 (from the past 48 hour(s))  Urinalysis, Routine w reflex microscopic (not at [facility])     Status: Abnormal   Collection Time: 10/16/15 11:15 PM  Result Value Ref Range   Color, Urine YELLOW YELLOW   APPearance CLEAR CLEAR   Specific Gravity, Urine 1.018 1.005 - 1.030   pH 8.0 5.0 - 8.0   Glucose, UA NEGATIVE NEGATIVE mg/dL   Hgb urine dipstick NEGATIVE NEGATIVE   Bilirubin Urine NEGATIVE NEGATIVE   Ketones, ur 15 (A) NEGATIVE mg/dL   Protein, ur NEGATIVE NEGATIVE mg/dL   Nitrite NEGATIVE NEGATIVE   Leukocytes, UA NEGATIVE NEGATIVE    Comment: MICROSCOPIC NOT DONE ON URINES WITH NEGATIVE PROTEIN, BLOOD, LEUKOCYTES, NITRITE, OR GLUCOSE <1000 mg/dL.  CBC with Differential/Platelet     Status: Abnormal   Collection Time: 10/16/15 11:22 PM  Result Value Ref Range   WBC 14.7 (H) 4.0 - 10.5 K/uL   RBC 4.88 3.87 - 5.11 MIL/uL   Hemoglobin 13.3 12.0 - 15.0 g/dL   HCT 40.8 36.0 - 46.0 %  MCV 83.6 78.0 - 100.0 fL   MCH 27.3 26.0 - 34.0 pg   MCHC 32.6 30.0 - 36.0 g/dL   RDW 13.7 11.5 - 15.5 %   Platelets 336 150 - 400 K/uL   Neutrophils Relative % 87 %   Neutro Abs 12.6 (H) 1.7 - 7.7 K/uL   Lymphocytes Relative 10 %   Lymphs Abs 1.5 0.7 - 4.0 K/uL   Monocytes Relative 3 %   Monocytes Absolute 0.5 0.1 - 1.0 K/uL   Eosinophils Relative 0 %   Eosinophils Absolute 0.0 0.0 - 0.7 K/uL   Basophils Relative 0 %   Basophils Absolute 0.0 0.0 - 0.1 K/uL  Comprehensive metabolic panel     Status: Abnormal   Collection Time: 10/16/15 11:22 PM  Result Value Ref Range   Sodium 135 135 - 145 mmol/L   Potassium 4.1 3.5 - 5.1 mmol/L   Chloride 101 101 - 111 mmol/L   CO2 26 22 -  32 mmol/L   Glucose, Bld 198 (H) 65 - 99 mg/dL   BUN 8 6 - 20 mg/dL   Creatinine, Ser 0.58 0.44 - 1.00 mg/dL   Calcium 8.6 (L) 8.9 - 10.3 mg/dL   Total Protein 7.7 6.5 - 8.1 g/dL   Albumin 4.0 3.5 - 5.0 g/dL   AST 38 15 - 41 U/L   ALT 39 14 - 54 U/L   Alkaline Phosphatase 63 38 - 126 U/L   Total Bilirubin 0.6 0.3 - 1.2 mg/dL   GFR calc non Af Amer >60 >60 mL/min   GFR calc Af Amer >60 >60 mL/min    Comment: (NOTE) The eGFR has been calculated using the CKD EPI equation. This calculation has not been validated in all clinical situations. eGFR's persistently <60 mL/min signify possible Chronic Kidney Disease.    Anion gap 8 5 - 15  Lipase, blood     Status: None   Collection Time: 10/16/15 11:22 PM  Result Value Ref Range   Lipase 21 11 - 51 U/L   US Abdomen Complete  10/17/2015  CLINICAL DATA:  Right lower quadrant pain radiating to the abdomen since 2 p.m. after eating steak for launch. Nausea and vomiting. EXAM: ABDOMEN ULTRASOUND COMPLETE COMPARISON:  CT abdomen and pelvis 07/01/2010 FINDINGS: Gallbladder: Cholelithiasis with a in nonmobile stone demonstrated in the gallbladder neck. Stone measures about 1.6 cm diameter. Gallbladder wall thickness is borderline. Murphy's sign is positive. Changes are consistent with acute cholecystitis in the appropriate clinical setting. Common bile duct: Diameter: 4.4 mm, normal Liver: Diffusely increased parenchymal echotexture consistent with fatty infiltration. No focal liver lesions identified. IVC: Not seen due to overlying bowel gas. Pancreas: Not seen due to overlying bowel gas. Spleen: Size and appearance within normal limits. Right Kidney: Length: 10.8 cm. Echogenicity within normal limits. No mass or hydronephrosis visualized. Left Kidney: Length: 11.3 cm. Echogenicity within normal limits. No mass or hydronephrosis visualized. Abdominal aorta: Mostly obscured by overlying bowel gas. Visualized portions are non aneurysmal. Other findings:  Examination is technically limited due to bowel gas and patient's body habitus. IMPRESSION: Cholelithiasis with mild gallbladder wall thickening and positive Murphy's sign. Changes are consistent with acute cholecystitis in the appropriate clinical setting. Diffuse fatty infiltration of the liver. Electronically Signed   By: Lucienne Capers M.D.   On: 10/17/2015 00:23    Review of Systems  Constitutional: Negative.   HENT: Negative.   Eyes: Negative.   Respiratory: Positive for cough.  History of asthma  Cardiovascular: Negative.   Gastrointestinal: Positive for nausea, vomiting and abdominal pain. Negative for diarrhea and constipation.  Genitourinary: Negative.   Musculoskeletal: Negative.   Skin: Negative.   Neurological: Negative.   Endo/Heme/Allergies: Negative.   Psychiatric/Behavioral: Negative.     Blood pressure 151/65, pulse 70, temperature 98.1 F (36.7 C), temperature source Oral, resp. rate 18, height 5' 4.5" (1.638 m), weight 90.719 kg (200 lb), SpO2 99 %. Physical Exam  Constitutional: She is oriented to person, place, and time. She appears well-developed and well-nourished.  HENT:  Head: Normocephalic and atraumatic.  Eyes: Conjunctivae are normal. Pupils are equal, round, and reactive to light.  Neck: Normal range of motion. Neck supple. No thyromegaly present.  Cardiovascular: Normal rate and intact distal pulses.   Respiratory: Effort normal. No respiratory distress.  GI: Soft. She exhibits no distension and no mass. There is tenderness. There is no rebound and no guarding.  Musculoskeletal: Normal range of motion.  Neurological: She is alert and oriented to person, place, and time. Coordination normal.  Skin: Skin is warm and dry. No rash noted. She is not diaphoretic. No erythema. No pallor.  Psychiatric: She has a normal mood and affect. Her behavior is normal. Judgment normal.     Assessment/Plan Acute calculous cholecystitis NPO IVF IV  antibiotics. To OR for lap chole with probable IOC.  The surgical procedure was described to the patient in detail.   I discussed the incision type and location, the location of the gallbladder, the anatomy of the bile ducts and arteries, and the typical progression of surgery.  I discussed the possibility of converting to an open operation.  I advised of the risks of bleeding, infection, damage to other structures (such as the bile duct, intestine or liver), bile leak, need for other procedures or surgeries, and post op diarrhea/constipation.  We discussed the risk of blood clot.  We discussed the recovery period and post operative restrictions.  The patient was advised against taking blood thinners the week before surgery.       Demetrion Wesby 10/17/2015, 7:02 AM

## 2015-10-17 NOTE — ED Notes (Signed)
Pt has PO fluids last at 2200. Pt last had solid meal at 1500.

## 2015-10-17 NOTE — Progress Notes (Signed)
I texted paged Dr. Laurelyn Sickle to let her know pt had arrived and was still nauseous.  Have received a response yet.

## 2015-10-17 NOTE — Anesthesia Preprocedure Evaluation (Addendum)
Anesthesia Evaluation  Patient identified by MRN, date of birth, ID band Patient awake    Reviewed: Allergy & Precautions, H&P , NPO status , Patient's Chart, lab work & pertinent test results  Airway Mallampati: II  TM Distance: >3 FB Neck ROM: full    Dental no notable dental hx. (+) Teeth Intact   Pulmonary asthma ,  Will use Albuterol inhaler prior to OR   Pulmonary exam normal        Cardiovascular negative cardio ROS Normal cardiovascular exam Rhythm:regular     Neuro/Psych negative neurological ROS  negative psych ROS   GI/Hepatic Neg liver ROS, GERD  Medicated and Controlled,  Endo/Other  negative endocrine ROS  Renal/GU negative Renal ROS     Musculoskeletal   Abdominal (+) + obese,   Peds  Hematology   Anesthesia Other Findings   Reproductive/Obstetrics negative OB ROS                            Anesthesia Physical Anesthesia Plan  ASA: II  Anesthesia Plan: General   Post-op Pain Management:    Induction: Intravenous  Airway Management Planned: Oral ETT  Additional Equipment:   Intra-op Plan:   Post-operative Plan: Extubation in OR  Informed Consent: I have reviewed the patients History and Physical, chart, labs and discussed the procedure including the risks, benefits and alternatives for the proposed anesthesia with the patient or authorized representative who has indicated his/her understanding and acceptance.   Dental advisory given  Plan Discussed with: CRNA and Surgeon  Anesthesia Plan Comments:        Anesthesia Quick Evaluation

## 2015-10-18 ENCOUNTER — Encounter (HOSPITAL_COMMUNITY): Payer: Self-pay | Admitting: General Surgery

## 2015-10-18 LAB — COMPREHENSIVE METABOLIC PANEL
ALBUMIN: 3.3 g/dL — AB (ref 3.5–5.0)
ALT: 40 U/L (ref 14–54)
AST: 41 U/L (ref 15–41)
Alkaline Phosphatase: 48 U/L (ref 38–126)
Anion gap: 6 (ref 5–15)
BILIRUBIN TOTAL: 0.6 mg/dL (ref 0.3–1.2)
BUN: 9 mg/dL (ref 6–20)
CO2: 26 mmol/L (ref 22–32)
CREATININE: 0.75 mg/dL (ref 0.44–1.00)
Calcium: 8.8 mg/dL — ABNORMAL LOW (ref 8.9–10.3)
Chloride: 101 mmol/L (ref 101–111)
GFR calc Af Amer: >60 mL/min (ref >60)
GLUCOSE: 179 mg/dL — AB (ref 65–99)
POTASSIUM: 4.5 mmol/L (ref 3.5–5.1)
Sodium: 133 mmol/L — ABNORMAL LOW (ref 135–145)
TOTAL PROTEIN: 6.5 g/dL (ref 6.5–8.1)

## 2015-10-18 LAB — CBC
HEMATOCRIT: 36.5 % (ref 36.0–46.0)
Hemoglobin: 11.8 g/dL — ABNORMAL LOW (ref 12.0–15.0)
MCH: 27.8 pg (ref 26.0–34.0)
MCHC: 32.3 g/dL (ref 30.0–36.0)
MCV: 86.1 fL (ref 78.0–100.0)
PLATELETS: 312 10*3/uL (ref 150–400)
RBC: 4.24 MIL/uL (ref 3.87–5.11)
RDW: 13.9 % (ref 11.5–15.5)
WBC: 14.9 10*3/uL — ABNORMAL HIGH (ref 4.0–10.5)

## 2015-10-18 MED ORDER — OXYCODONE HCL 5 MG PO TABS: 5.0000 mg | ORAL_TABLET | ORAL | Status: AC | PRN

## 2015-10-18 MED ORDER — DOCUSATE SODIUM 100 MG PO CAPS: 100.0000 mg | ORAL_CAPSULE | Freq: Two times a day (BID) | ORAL | Status: AC | PRN

## 2015-10-18 NOTE — Progress Notes (Signed)
Pt states that her nose feels congested today.  She says she takes Flonase at home for it.

## 2015-10-18 NOTE — Discharge Summary (Signed)
[facility] Discharge Summary   Patient ID: Alicia Dawson MRN: [id number] DOB/AGE: 64/17/1952 64 y.o.  Admit date: 10/16/2015 Discharge date: 10/18/2015  Admitting Diagnosis: Acute calculous cholecystitis  Discharge Diagnosis Patient Active Problem List   Diagnosis Date Noted  . Cholecystitis 10/17/2015  . Acute calculous cholecystitis 10/17/2015    Consultants None  Imaging: Dg Cholangiogram Operative  10/17/2015  CLINICAL DATA:  Cholelithiasis EXAM: INTRAOPERATIVE CHOLANGIOGRAM COMPARISON:  None. FLUOROSCOPY TIME:  Radiation Exposure Index (as provided by the fluoroscopic device): 8.94 mGy If the device does not provide the exposure index: Fluoroscopy Time:  14 seconds Number of Acquired Images:  19 within a cine run FINDINGS: Injection into the cystic duct remnant shows free flow contrast material into the common hepatic and common bile duct. Free flow contrast into the duodenum is seen. No filling defects are noted. Gallstone is noted within the gallbladder. IMPRESSION: No common bile duct retained stone is seen. Electronically Signed   By: Alcide Clever M.D.   On: 10/17/2015 10:37   US Abdomen Complete  10/17/2015  CLINICAL DATA:  Right lower quadrant pain radiating to the abdomen since 2 p.m. after eating steak for launch. Nausea and vomiting. EXAM: ABDOMEN ULTRASOUND COMPLETE COMPARISON:  CT abdomen and pelvis 07/01/2010 FINDINGS: Gallbladder: Cholelithiasis with a in nonmobile stone demonstrated in the gallbladder neck. Stone measures about 1.6 cm diameter. Gallbladder wall thickness is borderline. Murphy's sign is positive. Changes are consistent with acute cholecystitis in the appropriate clinical setting. Common bile duct: Diameter: 4.4 mm, normal Liver: Diffusely increased parenchymal echotexture consistent with fatty infiltration. No focal liver lesions identified. IVC: Not seen due to overlying bowel gas. Pancreas: Not seen due to overlying bowel gas.  Spleen: Size and appearance within normal limits. Right Kidney: Length: 10.8 cm. Echogenicity within normal limits. No mass or hydronephrosis visualized. Left Kidney: Length: 11.3 cm. Echogenicity within normal limits. No mass or hydronephrosis visualized. Abdominal aorta: Mostly obscured by overlying bowel gas. Visualized portions are non aneurysmal. Other findings: Examination is technically limited due to bowel gas and patient's body habitus. IMPRESSION: Cholelithiasis with mild gallbladder wall thickening and positive Murphy's sign. Changes are consistent with acute cholecystitis in the appropriate clinical setting. Diffuse fatty infiltration of the liver. Electronically Signed   By: Burman Nieves M.D.   On: 10/17/2015 00:23    Procedures Dr. Donell Beers (10/17/15) - Laparoscopic Cholecystectomy with NEG [facility]   Hospital Course:  65 yo F who presents with severe epigastric abdominal pain after eating hibachi. She also had associated nausea/vomiting. She denies diarrhea/constipation. She has not had any fever/ chills. The pain is centered in the upper abdomen and is very sharp. She has a history of diverticulitis and states this is nothing like that. She is still a little sore, but nothing like the pain from yesterday.   Workup showed acute cholecystitis.  Patient was admitted and underwent procedure listed above.  Tolerated procedure well and was transferred to the floor.  Diet was advanced as tolerated.  LFT's normal today, WBC about the same as yesterday, but will sometimes see a reactive elevation in WBC.  No findings on exam to be concerned for bile leak.  On POD #1, the patient was voiding well, tolerating diet, ambulating well, pain well controlled, vital signs stable, incisions c/d/i and felt stable for discharge home.  Patient will follow up in our office in 2 weeks and knows to call with questions or concerns.     Physical Exam: General:  Alert, NAD, pleasant,  comfortable Abd:  Soft,  ND, mild tenderness, incisions C/D/I     Medication List    TAKE these medications        albuterol 108 (90 Base) MCG/ACT inhaler  Commonly known as:  PROVENTIL HFA;VENTOLIN HFA  Inhale into the lungs every 6 (six) hours as needed for wheezing or shortness of breath.     budesonide-formoterol 160-4.5 MCG/ACT inhaler  Commonly known as:  SYMBICORT  Inhale 2 puffs into the lungs 2 (two) times daily.     cetirizine 10 MG tablet  Commonly known as:  ZYRTEC  Take 10 mg by mouth daily.     docusate sodium 100 MG capsule  Commonly known as:  COLACE  Take 1 capsule (100 mg total) by mouth 2 (two) times daily as needed for mild constipation.     esomeprazole 20 MG capsule  Commonly known as:  NEXIUM  Take 20 mg by mouth daily at 12 noon.     montelukast 10 MG tablet  Commonly known as:  SINGULAIR  Take 10 mg by mouth at bedtime.     multivitamin with minerals Tabs tablet  Take 1 tablet by mouth daily.     oxyCODONE 5 MG immediate release tablet  Commonly known as:  Oxy IR/ROXICODONE  Take 1-2 tablets (5-10 mg total) by mouth every 4 (four) hours as needed for moderate pain.         Follow-up Information    Follow up with [facility]. Go on 11/10/2015.   Specialty:  General Surgery   Why:  For post-operation check. Your appointment is at 9:40am, please arrive at least before your appointment to complete your check in paperwork.  If you are unable to arrive prior to your appointment time we may have to cancel or reschedule you.   Contact information:   [address] [place] [address] [phone]      Signed: Bradd Canary [facility] [phone]  10/18/2015, 11:07 AM

## 2015-10-18 NOTE — Discharge Instructions (Signed)
Your appointment is at 9:40am, please arrive at least 30min before your appointment to complete your check in paperwork.  If you are unable to arrive 30min prior to your appointment time we may have to cancel or reschedule you. ° °LAPAROSCOPIC SURGERY: POST OP INSTRUCTIONS  °1. DIET: Follow a light bland diet the first 24 hours after arrival home, such as soup, liquids, crackers, etc. Be sure to include lots of fluids daily. Avoid fast food or heavy meals as your are more likely to get nauseated. Eat a low fat the next few days after surgery.  °2. Take your usually prescribed home medications unless otherwise directed. °3. PAIN CONTROL:  °1. Pain is best controlled by a usual combination of three different methods TOGETHER:  °1. Ice/Heat °2. Over the counter pain medication °3. Prescription pain medication °2. Most patients will experience some swelling and bruising around the incisions. Ice packs or heating pads (30-60 minutes up to 6 times a day) will help. Use ice for the first few days to help decrease swelling and bruising, then switch to heat to help relax tight/sore spots and speed recovery. Some people prefer to use ice alone, heat alone, alternating between ice & heat. Experiment to what works for you. Swelling and bruising can take several weeks to resolve.  °3. It is helpful to take an over-the-counter pain medication regularly for the first few weeks. Choose one of the following that works best for you:  °1. Naproxen (Aleve, etc) Two 220mg tabs twice a day °2. Ibuprofen (Advil, etc) Three 200mg tabs four times a day (every meal & bedtime) °3. Acetaminophen (Tylenol, etc) 500-650mg four times a day (every meal & bedtime) °4. A prescription for pain medication (such as oxycodone, hydrocodone, etc) should be given to you upon discharge. Take your pain medication as prescribed.  °1. If you are having problems/concerns with the prescription medicine (does not control pain, nausea, vomiting, rash, itching,  etc), please call us [phone] to see if we need to switch you to a different pain medicine that will work better for you and/or control your side effect better. °2. If you need a refill on your pain medication, please contact your pharmacy. They will contact our office to request authorization. Prescriptions will not be filled after 5 pm or on week-ends. °4. Avoid getting constipated. Between the surgery and the pain medications, it is common to experience some constipation. Increasing fluid intake and taking a fiber supplement (such as Metamucil, Citrucel, FiberCon, MiraLax, etc) 1-2 times a day regularly will usually help prevent this problem from occurring. A mild laxative (prune juice, Milk of Magnesia, MiraLax, etc) should be taken according to package directions if there are no bowel movements after 48 hours.  °5. Watch out for diarrhea. If you have many loose bowel movements, simplify your diet to bland foods & liquids for a few days. Stop any stool softeners and decrease your fiber supplement. Switching to mild anti-diarrheal medications (Kayopectate, Pepto Bismol) can help. If this worsens or does not improve, please call us. °6. Wash / shower every day. You may shower over the dressings as they are waterproof. Continue to shower over incision(s) after the dressing is off. °7. Remove your waterproof bandages 5 days after surgery. You may leave the incision open to air. You may replace a dressing/Band-Aid to cover the incision for comfort if you wish.  °8. ACTIVITIES as tolerated:  °1. You may resume regular (light) daily activities beginning the next day--such as daily self-care,   walking, climbing stairs--gradually increasing activities as tolerated. If you can walk 30 minutes without difficulty, it is safe to try more intense activity such as jogging, treadmill, bicycling, low-impact aerobics, swimming, etc. °2. Save the most intensive and strenuous activity for last such as sit-ups, heavy lifting,  contact sports, etc Refrain from any heavy lifting or straining until you are off narcotics for pain control.  °3. DO NOT PUSH THROUGH PAIN. Let pain be your guide: If it hurts to do something, don't do it. Pain is your body warning you to avoid that activity for another week until the pain goes down. °4. You may drive when you are no longer taking prescription pain medication, you can comfortably wear a seatbelt, and you can safely maneuver your car and apply brakes. °5. You may have sexual intercourse when it is comfortable.  °9. FOLLOW UP in our office  °1. Please call [facility] at [phone] to set up an appointment to see your surgeon in the office for a follow-up appointment approximately 2-3 weeks after your surgery. °2. Make sure that you call for this appointment the day you arrive home to insure a convenient appointment time. °     10. IF YOU HAVE DISABILITY OR FAMILY LEAVE FORMS, BRING THEM TO THE               OFFICE FOR PROCESSING.  ° °WHEN TO CALL US [phone]:  °1. Poor pain control °2. Reactions / problems with new medications (rash/itching, nausea, etc)  °3. Fever over 101.5 F (38.5 C) °4. Inability to urinate °5. Nausea and/or vomiting °6. Worsening swelling or bruising °7. Continued bleeding from incision. °8. Increased pain, redness, or drainage from the incision ° °The clinic staff is available to answer your questions during regular business hours (8:30am-5pm). Please don’t hesitate to call and ask to speak to one of our nurses for clinical concerns.  °If you have a medical emergency, go to the nearest emergency room or call 911.  °A surgeon from [facility] is always on call at the hospitals  ° °[facility]  °[address], [address], [place], [place] [address] ?  °MAIN: [phone] ? TOLL FREE: [phone] ?  °FAX [phone]  °[email or web address] ° °

## 2016-12-02 IMAGING — US US ABDOMEN COMPLETE
1 series · 13 of 25 positions shown · non-contrast
Comparison: CT abdomen and pelvis 07/01/2010

CLINICAL DATA: Right lower quadrant pain radiating to the abdomen
since 2 p.m. after eating steak for launch. Nausea and vomiting.

EXAM:
ABDOMEN ULTRASOUND COMPLETE

[Series 1: us abdomen complete · 0.25mm/px · 13 of 104 slices shown]
[im 1/104]
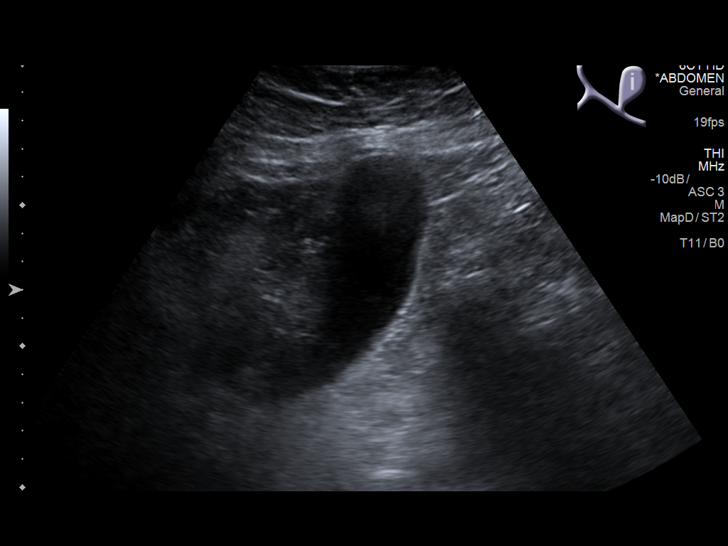
[im 9/104]
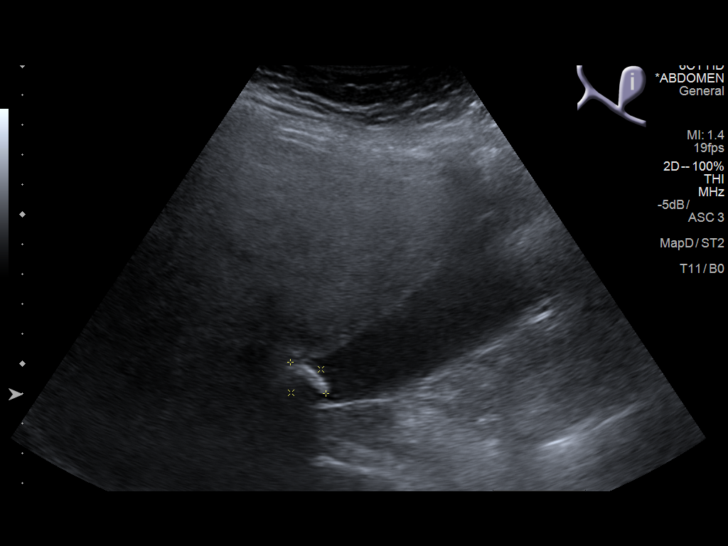
[im 18/104]
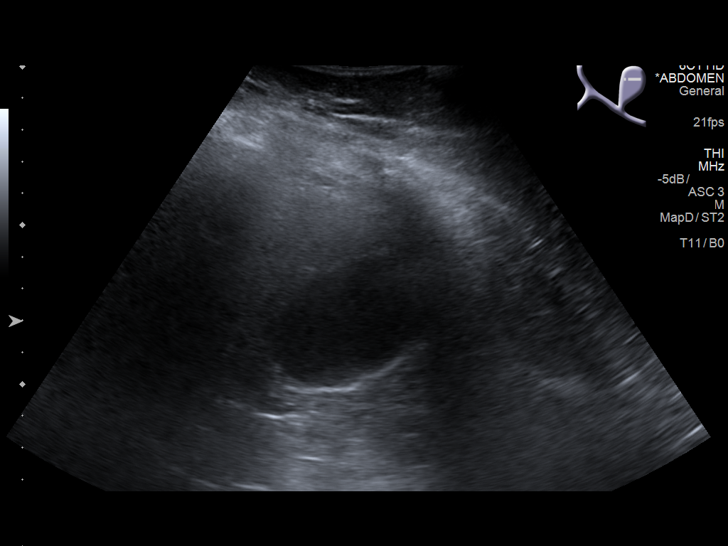
[im 26/104]
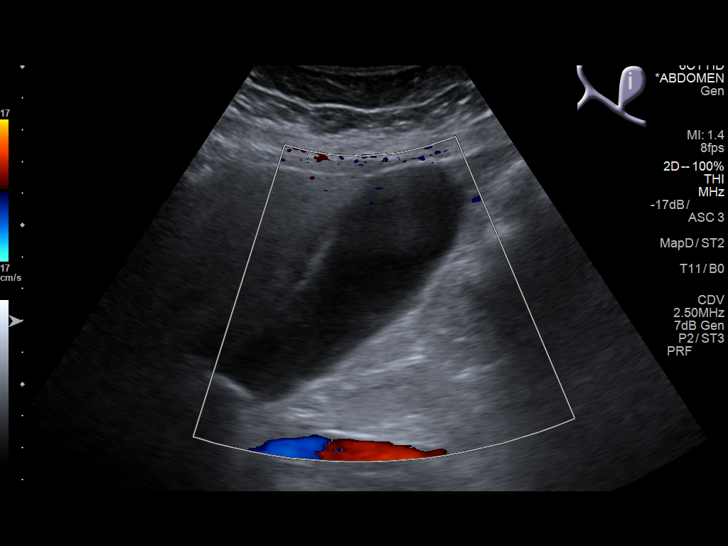
[im 35/104]
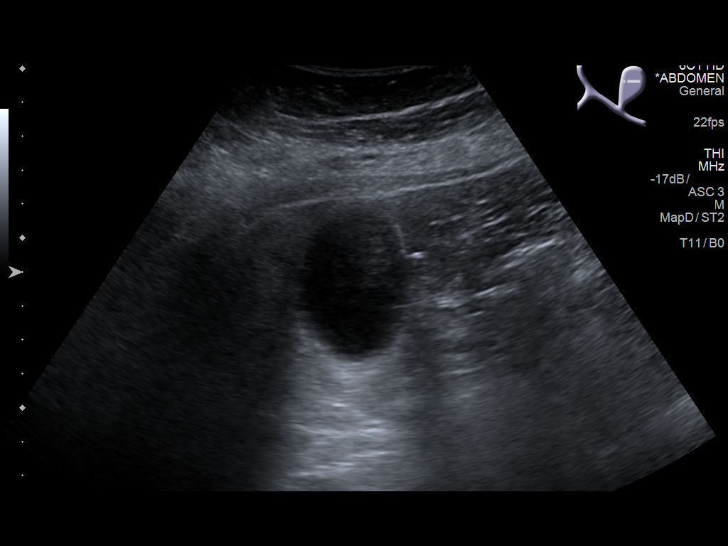
[im 43/104]
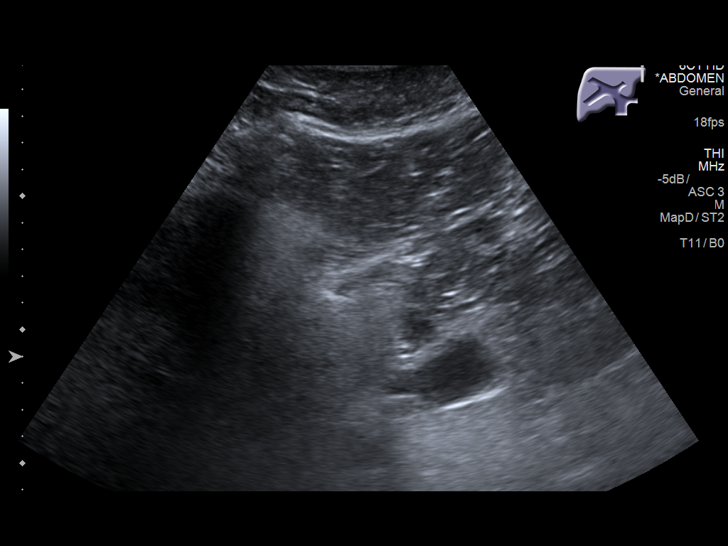
[im 52/104]
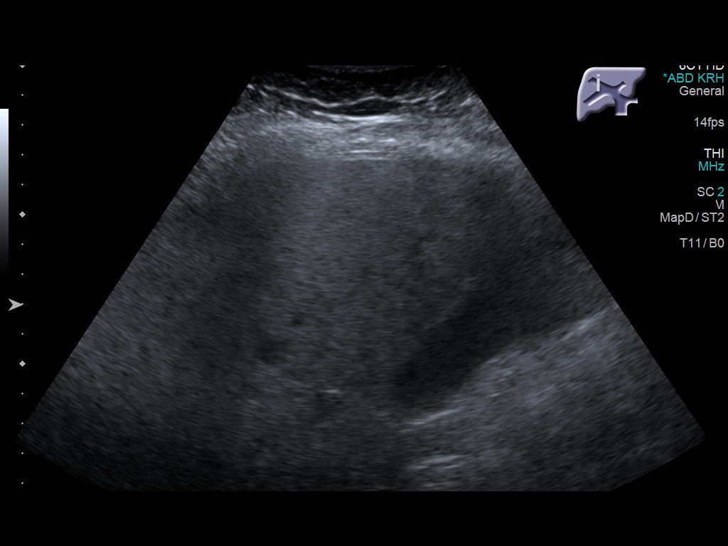
[im 61/104]
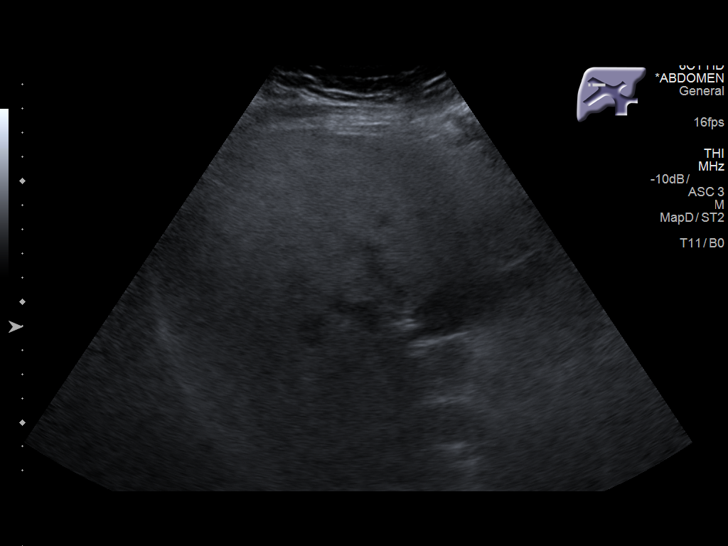
[im 69/104]
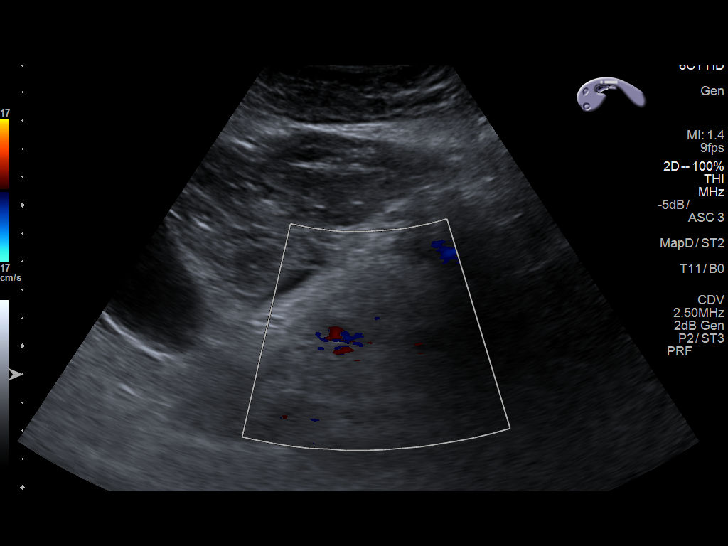
[im 78/104]
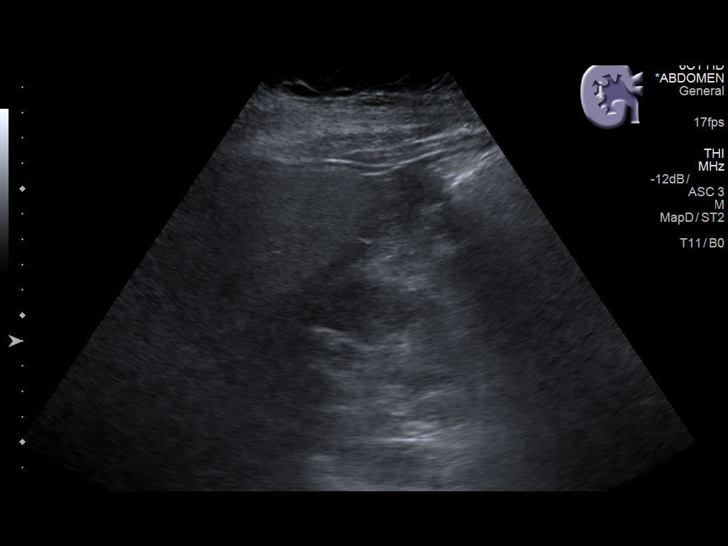
[im 86/104]
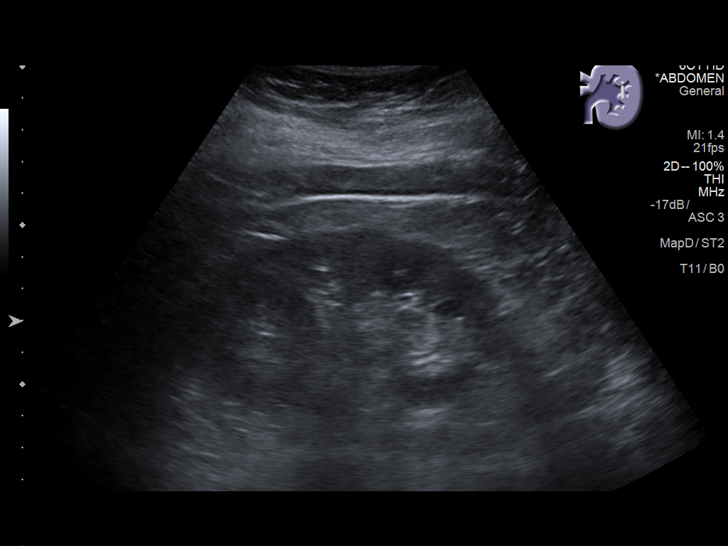
[im 95/104]
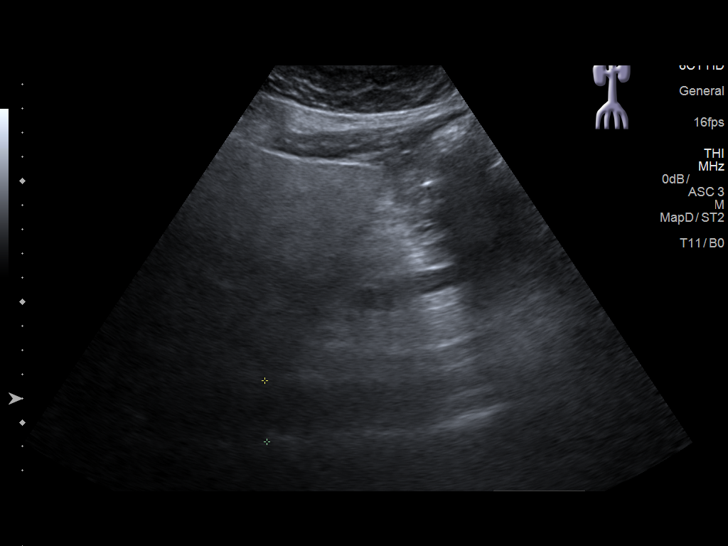
[im 104/104]
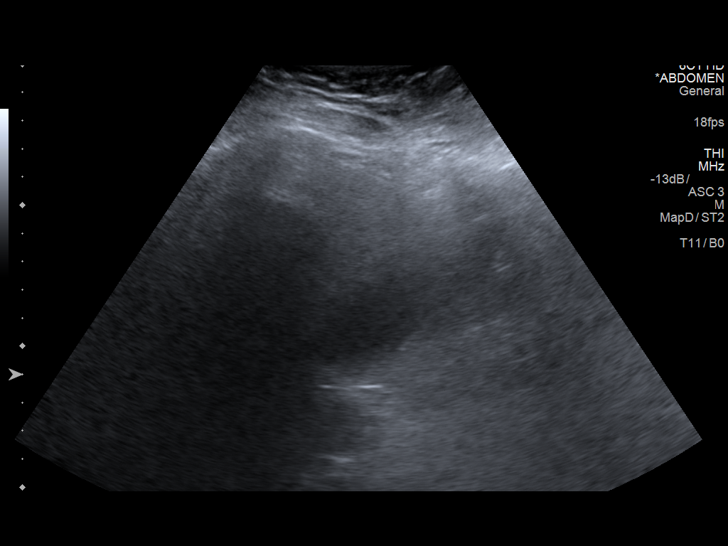

[13 of 25 positions shown; findings below may reference images not displayed]

FINDINGS: Gallbladder: Cholelithiasis with a in nonmobile stone demonstrated
in the gallbladder neck. Stone measures about 1.6 cm diameter.
Gallbladder wall thickness is borderline. Murphy's sign is positive.
Changes are consistent with acute cholecystitis in the appropriate
clinical setting.

Common bile duct: Diameter: 4.4 mm, normal

Liver: Diffusely increased parenchymal echotexture consistent with
fatty infiltration. No focal liver lesions identified.

IVC: Not seen due to overlying bowel gas.

Pancreas: Not seen due to overlying bowel gas.

Spleen: Size and appearance within normal limits.

Right Kidney: Length: 10.8 cm. Echogenicity within normal limits. No
mass or hydronephrosis visualized.

Left Kidney: Length: 11.3 cm. Echogenicity within normal limits. No
mass or hydronephrosis visualized.

Abdominal aorta: Mostly obscured by overlying bowel gas. Visualized
portions are non aneurysmal.

Other findings: Examination is technically limited due to bowel gas
and patient's body habitus.
IMPRESSION: Cholelithiasis with mild gallbladder wall thickening and positive
Murphy's sign. Changes are consistent with acute cholecystitis in
the appropriate clinical setting. Diffuse fatty infiltration of the
liver.

## 2017-11-01 ENCOUNTER — Other Ambulatory Visit: Payer: Self-pay | Admitting: Internal Medicine

## 2017-11-01 DIAGNOSIS — E2839 Other primary ovarian failure: Secondary | ICD-10-CM

## 2019-07-01 ENCOUNTER — Other Ambulatory Visit: Payer: Self-pay | Admitting: Family Medicine

## 2019-07-01 DIAGNOSIS — Z1231 Encounter for screening mammogram for malignant neoplasm of breast: Secondary | ICD-10-CM

## 2019-07-01 DIAGNOSIS — Z13820 Encounter for screening for osteoporosis: Secondary | ICD-10-CM

## 2019-10-28 ENCOUNTER — Ambulatory Visit: Payer: BLUE CROSS/BLUE SHIELD | Attending: Internal Medicine

## 2019-10-30 ENCOUNTER — Ambulatory Visit: Payer: BLUE CROSS/BLUE SHIELD

## 2020-07-20 ENCOUNTER — Other Ambulatory Visit: Payer: Self-pay | Admitting: Family Medicine

## 2020-07-23 ENCOUNTER — Other Ambulatory Visit: Payer: Self-pay | Admitting: Family Medicine

## 2020-07-23 DIAGNOSIS — Z13820 Encounter for screening for osteoporosis: Secondary | ICD-10-CM

## 2020-07-23 DIAGNOSIS — E2839 Other primary ovarian failure: Secondary | ICD-10-CM

## 2020-11-03 DIAGNOSIS — E119 Type 2 diabetes mellitus without complications: Secondary | ICD-10-CM | POA: Diagnosis not present

## 2020-12-08 ENCOUNTER — Other Ambulatory Visit: Payer: Self-pay

## 2020-12-08 ENCOUNTER — Ambulatory Visit
Admission: RE | Admit: 2020-12-08 | Discharge: 2020-12-08 | Disposition: A | Discharge status: Home / Self Care | Payer: Medicare Other | Source: Ambulatory Visit | Attending: Family Medicine | Admitting: Family Medicine

## 2020-12-08 DIAGNOSIS — Z780 Asymptomatic menopausal state: Secondary | ICD-10-CM | POA: Diagnosis not present

## 2020-12-08 DIAGNOSIS — Z13820 Encounter for screening for osteoporosis: Secondary | ICD-10-CM

## 2020-12-08 DIAGNOSIS — E2839 Other primary ovarian failure: Secondary | ICD-10-CM

## 2020-12-27 DIAGNOSIS — I10 Essential (primary) hypertension: Secondary | ICD-10-CM | POA: Diagnosis not present

## 2020-12-27 DIAGNOSIS — J4540 Moderate persistent asthma, uncomplicated: Secondary | ICD-10-CM | POA: Diagnosis not present

## 2020-12-27 DIAGNOSIS — E782 Mixed hyperlipidemia: Secondary | ICD-10-CM | POA: Diagnosis not present

## 2020-12-27 DIAGNOSIS — Z7984 Long term (current) use of oral hypoglycemic drugs: Secondary | ICD-10-CM | POA: Diagnosis not present

## 2020-12-27 DIAGNOSIS — E119 Type 2 diabetes mellitus without complications: Secondary | ICD-10-CM | POA: Diagnosis not present

## 2021-03-17 DIAGNOSIS — I10 Essential (primary) hypertension: Secondary | ICD-10-CM | POA: Diagnosis not present

## 2021-03-17 DIAGNOSIS — J4540 Moderate persistent asthma, uncomplicated: Secondary | ICD-10-CM | POA: Diagnosis not present

## 2021-03-17 DIAGNOSIS — J45901 Unspecified asthma with (acute) exacerbation: Secondary | ICD-10-CM | POA: Diagnosis not present

## 2021-03-17 DIAGNOSIS — E119 Type 2 diabetes mellitus without complications: Secondary | ICD-10-CM | POA: Diagnosis not present

## 2021-03-17 DIAGNOSIS — J45998 Other asthma: Secondary | ICD-10-CM | POA: Diagnosis not present

## 2021-03-17 DIAGNOSIS — E782 Mixed hyperlipidemia: Secondary | ICD-10-CM | POA: Diagnosis not present

## 2021-03-17 DIAGNOSIS — K219 Gastro-esophageal reflux disease without esophagitis: Secondary | ICD-10-CM | POA: Diagnosis not present

## 2021-10-03 DIAGNOSIS — E782 Mixed hyperlipidemia: Secondary | ICD-10-CM | POA: Diagnosis not present

## 2021-10-03 DIAGNOSIS — E119 Type 2 diabetes mellitus without complications: Secondary | ICD-10-CM | POA: Diagnosis not present

## 2021-10-03 DIAGNOSIS — J45998 Other asthma: Secondary | ICD-10-CM | POA: Diagnosis not present

## 2021-10-03 DIAGNOSIS — I10 Essential (primary) hypertension: Secondary | ICD-10-CM | POA: Diagnosis not present

## 2021-10-03 DIAGNOSIS — J45901 Unspecified asthma with (acute) exacerbation: Secondary | ICD-10-CM | POA: Diagnosis not present

## 2021-10-03 DIAGNOSIS — J4540 Moderate persistent asthma, uncomplicated: Secondary | ICD-10-CM | POA: Diagnosis not present

## 2021-10-03 DIAGNOSIS — K219 Gastro-esophageal reflux disease without esophagitis: Secondary | ICD-10-CM | POA: Diagnosis not present

## 2021-11-12 DIAGNOSIS — E782 Mixed hyperlipidemia: Secondary | ICD-10-CM | POA: Diagnosis not present

## 2021-11-12 DIAGNOSIS — I10 Essential (primary) hypertension: Secondary | ICD-10-CM | POA: Diagnosis not present

## 2021-11-12 DIAGNOSIS — K219 Gastro-esophageal reflux disease without esophagitis: Secondary | ICD-10-CM | POA: Diagnosis not present

## 2021-11-12 DIAGNOSIS — E119 Type 2 diabetes mellitus without complications: Secondary | ICD-10-CM | POA: Diagnosis not present

## 2021-11-12 DIAGNOSIS — J45998 Other asthma: Secondary | ICD-10-CM | POA: Diagnosis not present

## 2021-11-12 DIAGNOSIS — J4540 Moderate persistent asthma, uncomplicated: Secondary | ICD-10-CM | POA: Diagnosis not present

## 2021-12-15 DIAGNOSIS — Z0000 Encounter for general adult medical examination without abnormal findings: Secondary | ICD-10-CM | POA: Diagnosis not present

## 2021-12-15 DIAGNOSIS — I10 Essential (primary) hypertension: Secondary | ICD-10-CM | POA: Diagnosis not present

## 2021-12-15 DIAGNOSIS — E782 Mixed hyperlipidemia: Secondary | ICD-10-CM | POA: Diagnosis not present

## 2021-12-15 DIAGNOSIS — E119 Type 2 diabetes mellitus without complications: Secondary | ICD-10-CM | POA: Diagnosis not present

## 2021-12-15 DIAGNOSIS — K219 Gastro-esophageal reflux disease without esophagitis: Secondary | ICD-10-CM | POA: Diagnosis not present

## 2021-12-15 DIAGNOSIS — J4540 Moderate persistent asthma, uncomplicated: Secondary | ICD-10-CM | POA: Diagnosis not present

## 2022-06-29 DIAGNOSIS — J4540 Moderate persistent asthma, uncomplicated: Secondary | ICD-10-CM | POA: Diagnosis not present

## 2022-06-29 DIAGNOSIS — I10 Essential (primary) hypertension: Secondary | ICD-10-CM | POA: Diagnosis not present

## 2022-06-29 DIAGNOSIS — E119 Type 2 diabetes mellitus without complications: Secondary | ICD-10-CM | POA: Diagnosis not present

## 2022-06-29 DIAGNOSIS — E782 Mixed hyperlipidemia: Secondary | ICD-10-CM | POA: Diagnosis not present

## 2022-11-17 DIAGNOSIS — H35353 Cystoid macular degeneration, bilateral: Secondary | ICD-10-CM | POA: Diagnosis not present

## 2022-12-05 DIAGNOSIS — H25813 Combined forms of age-related cataract, bilateral: Secondary | ICD-10-CM | POA: Diagnosis not present

## 2022-12-05 DIAGNOSIS — H43813 Vitreous degeneration, bilateral: Secondary | ICD-10-CM | POA: Diagnosis not present

## 2022-12-05 DIAGNOSIS — H11042 Peripheral pterygium, stationary, left eye: Secondary | ICD-10-CM | POA: Diagnosis not present

## 2023-01-02 DIAGNOSIS — Z1211 Encounter for screening for malignant neoplasm of colon: Secondary | ICD-10-CM | POA: Diagnosis not present

## 2023-01-02 DIAGNOSIS — J4540 Moderate persistent asthma, uncomplicated: Secondary | ICD-10-CM | POA: Diagnosis not present

## 2023-01-02 DIAGNOSIS — E782 Mixed hyperlipidemia: Secondary | ICD-10-CM | POA: Diagnosis not present

## 2023-01-02 DIAGNOSIS — I10 Essential (primary) hypertension: Secondary | ICD-10-CM | POA: Diagnosis not present

## 2023-01-02 DIAGNOSIS — K219 Gastro-esophageal reflux disease without esophagitis: Secondary | ICD-10-CM | POA: Diagnosis not present

## 2023-01-02 DIAGNOSIS — Z23 Encounter for immunization: Secondary | ICD-10-CM | POA: Diagnosis not present

## 2023-01-02 DIAGNOSIS — Z0000 Encounter for general adult medical examination without abnormal findings: Secondary | ICD-10-CM | POA: Diagnosis not present

## 2023-01-02 DIAGNOSIS — Z13820 Encounter for screening for osteoporosis: Secondary | ICD-10-CM | POA: Diagnosis not present

## 2023-01-02 DIAGNOSIS — Z9181 History of falling: Secondary | ICD-10-CM | POA: Diagnosis not present

## 2023-01-02 DIAGNOSIS — E119 Type 2 diabetes mellitus without complications: Secondary | ICD-10-CM | POA: Diagnosis not present

## 2023-01-03 ENCOUNTER — Other Ambulatory Visit: Payer: Self-pay | Admitting: Family Medicine

## 2023-01-03 DIAGNOSIS — Z13820 Encounter for screening for osteoporosis: Secondary | ICD-10-CM

## 2023-02-07 DIAGNOSIS — H11232 Symblepharon, left eye: Secondary | ICD-10-CM | POA: Diagnosis not present

## 2023-02-07 DIAGNOSIS — H11042 Peripheral pterygium, stationary, left eye: Secondary | ICD-10-CM | POA: Diagnosis not present

## 2023-02-07 DIAGNOSIS — H11022 Central pterygium of left eye: Secondary | ICD-10-CM | POA: Diagnosis not present

## 2023-02-13 DIAGNOSIS — Z9889 Other specified postprocedural states: Secondary | ICD-10-CM | POA: Diagnosis not present

## 2023-02-13 DIAGNOSIS — H25813 Combined forms of age-related cataract, bilateral: Secondary | ICD-10-CM | POA: Diagnosis not present

## 2023-02-13 DIAGNOSIS — H43813 Vitreous degeneration, bilateral: Secondary | ICD-10-CM | POA: Diagnosis not present

## 2023-03-06 DIAGNOSIS — H25813 Combined forms of age-related cataract, bilateral: Secondary | ICD-10-CM | POA: Diagnosis not present

## 2023-03-06 DIAGNOSIS — Z9889 Other specified postprocedural states: Secondary | ICD-10-CM | POA: Diagnosis not present

## 2023-03-06 DIAGNOSIS — H43813 Vitreous degeneration, bilateral: Secondary | ICD-10-CM | POA: Diagnosis not present

## 2023-06-04 DIAGNOSIS — H25813 Combined forms of age-related cataract, bilateral: Secondary | ICD-10-CM | POA: Diagnosis not present

## 2023-06-21 DIAGNOSIS — E119 Type 2 diabetes mellitus without complications: Secondary | ICD-10-CM | POA: Diagnosis not present

## 2023-06-21 DIAGNOSIS — J45909 Unspecified asthma, uncomplicated: Secondary | ICD-10-CM | POA: Diagnosis not present

## 2023-06-21 DIAGNOSIS — I10 Essential (primary) hypertension: Secondary | ICD-10-CM | POA: Diagnosis not present

## 2023-06-21 DIAGNOSIS — H269 Unspecified cataract: Secondary | ICD-10-CM | POA: Diagnosis not present

## 2023-06-21 DIAGNOSIS — H25811 Combined forms of age-related cataract, right eye: Secondary | ICD-10-CM | POA: Diagnosis not present

## 2023-06-22 DIAGNOSIS — Z9889 Other specified postprocedural states: Secondary | ICD-10-CM | POA: Diagnosis not present

## 2023-06-22 DIAGNOSIS — H25812 Combined forms of age-related cataract, left eye: Secondary | ICD-10-CM | POA: Diagnosis not present

## 2023-06-22 DIAGNOSIS — Z961 Presence of intraocular lens: Secondary | ICD-10-CM | POA: Diagnosis not present

## 2023-06-22 DIAGNOSIS — H43813 Vitreous degeneration, bilateral: Secondary | ICD-10-CM | POA: Diagnosis not present

## 2023-06-28 DIAGNOSIS — Z961 Presence of intraocular lens: Secondary | ICD-10-CM | POA: Diagnosis not present

## 2023-06-28 DIAGNOSIS — Z9889 Other specified postprocedural states: Secondary | ICD-10-CM | POA: Diagnosis not present

## 2023-06-28 DIAGNOSIS — H25812 Combined forms of age-related cataract, left eye: Secondary | ICD-10-CM | POA: Diagnosis not present

## 2023-06-28 DIAGNOSIS — H43813 Vitreous degeneration, bilateral: Secondary | ICD-10-CM | POA: Diagnosis not present

## 2023-07-05 DIAGNOSIS — H25812 Combined forms of age-related cataract, left eye: Secondary | ICD-10-CM | POA: Diagnosis not present

## 2023-07-05 DIAGNOSIS — H269 Unspecified cataract: Secondary | ICD-10-CM | POA: Diagnosis not present

## 2023-07-05 DIAGNOSIS — J45909 Unspecified asthma, uncomplicated: Secondary | ICD-10-CM | POA: Diagnosis not present

## 2023-07-05 DIAGNOSIS — H2512 Age-related nuclear cataract, left eye: Secondary | ICD-10-CM | POA: Diagnosis not present

## 2023-07-06 DIAGNOSIS — Z9889 Other specified postprocedural states: Secondary | ICD-10-CM | POA: Diagnosis not present

## 2023-07-06 DIAGNOSIS — H43813 Vitreous degeneration, bilateral: Secondary | ICD-10-CM | POA: Diagnosis not present

## 2023-07-06 DIAGNOSIS — Z961 Presence of intraocular lens: Secondary | ICD-10-CM | POA: Diagnosis not present

## 2023-07-24 DIAGNOSIS — Z961 Presence of intraocular lens: Secondary | ICD-10-CM | POA: Diagnosis not present

## 2023-07-24 DIAGNOSIS — Z9889 Other specified postprocedural states: Secondary | ICD-10-CM | POA: Diagnosis not present

## 2023-07-24 DIAGNOSIS — H43813 Vitreous degeneration, bilateral: Secondary | ICD-10-CM | POA: Diagnosis not present

## 2023-09-06 DIAGNOSIS — J018 Other acute sinusitis: Secondary | ICD-10-CM | POA: Diagnosis not present

## 2023-09-06 DIAGNOSIS — J4521 Mild intermittent asthma with (acute) exacerbation: Secondary | ICD-10-CM | POA: Diagnosis not present

## 2023-09-06 DIAGNOSIS — I10 Essential (primary) hypertension: Secondary | ICD-10-CM | POA: Diagnosis not present

## 2023-09-06 DIAGNOSIS — E782 Mixed hyperlipidemia: Secondary | ICD-10-CM | POA: Diagnosis not present

## 2023-09-06 DIAGNOSIS — E119 Type 2 diabetes mellitus without complications: Secondary | ICD-10-CM | POA: Diagnosis not present

## 2024-01-03 DIAGNOSIS — I10 Essential (primary) hypertension: Secondary | ICD-10-CM | POA: Diagnosis not present

## 2024-01-03 DIAGNOSIS — J309 Allergic rhinitis, unspecified: Secondary | ICD-10-CM | POA: Diagnosis not present

## 2024-01-03 DIAGNOSIS — J45998 Other asthma: Secondary | ICD-10-CM | POA: Diagnosis not present

## 2024-01-03 DIAGNOSIS — E782 Mixed hyperlipidemia: Secondary | ICD-10-CM | POA: Diagnosis not present

## 2024-01-03 DIAGNOSIS — Z0000 Encounter for general adult medical examination without abnormal findings: Secondary | ICD-10-CM | POA: Diagnosis not present

## 2024-01-03 DIAGNOSIS — Z5320 Procedure and treatment not carried out because of patient's decision for unspecified reasons: Secondary | ICD-10-CM | POA: Diagnosis not present

## 2024-01-03 DIAGNOSIS — E119 Type 2 diabetes mellitus without complications: Secondary | ICD-10-CM | POA: Diagnosis not present

## 2024-07-04 DIAGNOSIS — E119 Type 2 diabetes mellitus without complications: Secondary | ICD-10-CM | POA: Diagnosis not present

## 2024-07-04 DIAGNOSIS — J45998 Other asthma: Secondary | ICD-10-CM | POA: Diagnosis not present

## 2024-07-04 DIAGNOSIS — E782 Mixed hyperlipidemia: Secondary | ICD-10-CM | POA: Diagnosis not present

## 2024-07-04 DIAGNOSIS — K219 Gastro-esophageal reflux disease without esophagitis: Secondary | ICD-10-CM | POA: Diagnosis not present

## 2024-07-04 DIAGNOSIS — J309 Allergic rhinitis, unspecified: Secondary | ICD-10-CM | POA: Diagnosis not present

## 2024-07-04 DIAGNOSIS — J4540 Moderate persistent asthma, uncomplicated: Secondary | ICD-10-CM | POA: Diagnosis not present

## 2024-07-04 DIAGNOSIS — I10 Essential (primary) hypertension: Secondary | ICD-10-CM | POA: Diagnosis not present

## 2024-07-18 DIAGNOSIS — J209 Acute bronchitis, unspecified: Secondary | ICD-10-CM | POA: Diagnosis not present

## 2024-07-18 DIAGNOSIS — J018 Other acute sinusitis: Secondary | ICD-10-CM | POA: Diagnosis not present
